# Patient Record
Sex: Female | Born: 1945 | Race: White | Hispanic: No | Marital: Married | Smoking: Never smoker
Health system: Southern US, Community
[De-identification: ages and names within clinical notes are randomized; demographics above are authoritative.]

## PROBLEM LIST (undated history)

## (undated) DIAGNOSIS — K5790 Diverticulosis of intestine, part unspecified, without perforation or abscess without bleeding: Secondary | ICD-10-CM

## (undated) DIAGNOSIS — G4730 Sleep apnea, unspecified: Secondary | ICD-10-CM

## (undated) HISTORY — PX: JOINT REPLACEMENT: SHX530

## (undated) HISTORY — DX: Diverticulosis of intestine, part unspecified, without perforation or abscess without bleeding: K57.90

## (undated) HISTORY — DX: Sleep apnea, unspecified: G47.30

---

## 1981-11-06 HISTORY — PX: RHINOPLASTY: SUR1284

## 1982-11-06 HISTORY — PX: TUBAL LIGATION: SHX77

## 1998-07-01 ENCOUNTER — Other Ambulatory Visit: Admission: RE | Admit: 1998-07-01 | Discharge: 1998-07-01 | Payer: Self-pay | Admitting: Obstetrics and Gynecology

## 1999-09-07 ENCOUNTER — Other Ambulatory Visit: Admission: RE | Admit: 1999-09-07 | Discharge: 1999-09-07 | Payer: Self-pay | Admitting: Obstetrics and Gynecology

## 2000-10-10 ENCOUNTER — Other Ambulatory Visit: Admission: RE | Admit: 2000-10-10 | Discharge: 2000-10-10 | Payer: Self-pay | Admitting: Obstetrics and Gynecology

## 2001-01-16 ENCOUNTER — Emergency Department (HOSPITAL_COMMUNITY): Admission: EM | Admit: 2001-01-16 | Discharge: 2001-01-16 | Payer: Self-pay | Admitting: Emergency Medicine

## 2001-01-24 ENCOUNTER — Emergency Department (HOSPITAL_COMMUNITY): Admission: EM | Admit: 2001-01-24 | Discharge: 2001-01-24 | Payer: Self-pay | Admitting: Emergency Medicine

## 2001-08-13 ENCOUNTER — Ambulatory Visit (HOSPITAL_BASED_OUTPATIENT_CLINIC_OR_DEPARTMENT_OTHER): Admission: RE | Admit: 2001-08-13 | Discharge: 2001-08-13 | Payer: Self-pay | Admitting: Family Medicine

## 2002-01-14 ENCOUNTER — Other Ambulatory Visit: Admission: RE | Admit: 2002-01-14 | Discharge: 2002-01-14 | Payer: Self-pay | Admitting: Obstetrics and Gynecology

## 2002-04-10 ENCOUNTER — Ambulatory Visit (HOSPITAL_COMMUNITY): Admission: RE | Admit: 2002-04-10 | Discharge: 2002-04-10 | Payer: Self-pay | Admitting: Gastroenterology

## 2003-01-20 ENCOUNTER — Other Ambulatory Visit: Admission: RE | Admit: 2003-01-20 | Discharge: 2003-01-20 | Payer: Self-pay | Admitting: Obstetrics and Gynecology

## 2004-02-04 ENCOUNTER — Other Ambulatory Visit: Admission: RE | Admit: 2004-02-04 | Discharge: 2004-02-04 | Payer: Self-pay | Admitting: Obstetrics and Gynecology

## 2004-11-15 ENCOUNTER — Ambulatory Visit: Payer: Self-pay | Admitting: Pulmonary Disease

## 2004-11-28 ENCOUNTER — Ambulatory Visit: Payer: Self-pay | Admitting: Pulmonary Disease

## 2005-04-13 ENCOUNTER — Other Ambulatory Visit: Admission: RE | Admit: 2005-04-13 | Discharge: 2005-04-13 | Payer: Self-pay | Admitting: Addiction Medicine

## 2005-10-18 ENCOUNTER — Ambulatory Visit: Payer: Self-pay | Admitting: Pulmonary Disease

## 2006-06-11 ENCOUNTER — Other Ambulatory Visit: Admission: RE | Admit: 2006-06-11 | Discharge: 2006-06-11 | Payer: Self-pay | Admitting: Obstetrics and Gynecology

## 2007-01-04 ENCOUNTER — Ambulatory Visit: Payer: Self-pay | Admitting: Pulmonary Disease

## 2007-09-09 ENCOUNTER — Other Ambulatory Visit: Admission: RE | Admit: 2007-09-09 | Discharge: 2007-09-09 | Payer: Self-pay | Admitting: Obstetrics and Gynecology

## 2008-12-07 ENCOUNTER — Other Ambulatory Visit: Admission: RE | Admit: 2008-12-07 | Discharge: 2008-12-07 | Payer: Self-pay | Admitting: Obstetrics and Gynecology

## 2008-12-07 ENCOUNTER — Ambulatory Visit: Payer: Self-pay | Admitting: Obstetrics and Gynecology

## 2008-12-07 ENCOUNTER — Encounter: Payer: Self-pay | Admitting: Obstetrics and Gynecology

## 2010-12-20 ENCOUNTER — Encounter: Payer: Self-pay | Admitting: Cardiovascular Disease

## 2010-12-27 ENCOUNTER — Encounter: Payer: Self-pay | Admitting: Cardiovascular Disease

## 2011-01-06 ENCOUNTER — Institutional Professional Consult (permissible substitution) (INDEPENDENT_AMBULATORY_CARE_PROVIDER_SITE_OTHER): Payer: BC Managed Care – PPO | Admitting: Cardiovascular Disease

## 2011-01-06 ENCOUNTER — Encounter: Payer: Self-pay | Admitting: Cardiovascular Disease

## 2011-01-06 DIAGNOSIS — R0602 Shortness of breath: Secondary | ICD-10-CM | POA: Insufficient documentation

## 2011-01-12 NOTE — Assessment & Plan Note (Signed)
Summary: consult: sob. per [facility] [phone]. pt has bcbs.gd  Medications Added ADVAIR DISKUS 500-50 MCG/DOSE AEPB (FLUTICASONE-SALMETEROL) DAILY.....as needed DOXYCYCLINE HYCLATE 100 MG SOLR (DOXYCYCLINE HYCLATE) 1 tab by mouth two times a day * 0.1 % SUSP (FLUOROMETHOLONE ACETATE) as needed        Referring Provider:  Dr. Pearletha Furl. [name] Primary Provider:  Dr. Pearletha Furl. [name]  CC:  referal from Dr. [name] for sob.  History of Present Illness: 65 yo WF with history of asthma, GERD here today for cardiac evaluation. She tells me that she feels well overall. She notices that at times she talks to her husband and gets dyspneic with this. She also has dyspnea with walking. NO chest pain, palpitations, near syncope or syncope. NO prior cardiac issues.   Current Medications (verified): 1)  Advair Diskus 500-50 Mcg/dose Aepb (Fluticasone-Salmeterol) .... Daily...Marland KitchenMarland Kitchenas Needed 2)  Aspirin 81 Mg Tbec (Aspirin) .... Take One Tablet By Mouth Daily 3)  Fenoprofen Calcium 600 Mg Tabs (Fenoprofen Calcium) .... Once Daily 4)  Multivitamins  Tabs (Multiple Vitamin) .... Once Daily 5)  Tylenol Extra Strength 500 Mg Tabs (Acetaminophen) .... As Needed 6)  Doxycycline Hyclate 100 Mg Solr (Doxycycline Hyclate) .Marland Kitchen.. 1 Tab By Mouth Two Times A Day 7)  0.1 % Susp (Fluorometholone Acetate) .... As Needed  Past History:  Past Medical History: Asthma GERD  Past Surgical History: Reviewed history from 01/06/2011 and no changes required. Tubal Ligation - 1984 Rhinoplasty - 1983  Family History: Reviewed history from 01/06/2011 and no changes required. Father: died at age 42 from stroke, MI, CAD,CABG Mother: died at age 60 from emphysema,CHF Siblings: 5 1-brother died at  age 77 from suicide; 1-brother died at age 8 from lung cancer  Social History: Reviewed history from 01/06/2011 and no changes required. Full Time: accountant at [employer] Married  with 1-child Tobacco Use - No. Never  been a smoker Alcohol Use - yes Regular Exercise - yes.  Drug Use - no  Review of Systems       The patient complains of shortness of breath.  The patient denies fatigue, malaise, fever, weight gain/loss, vision loss, decreased hearing, hoarseness, chest pain, palpitations, prolonged cough, wheezing, sleep apnea, coughing up blood, abdominal pain, blood in stool, nausea, vomiting, diarrhea, heartburn, incontinence, blood in urine, muscle weakness, joint pain, leg swelling, rash, skin lesions, headache, fainting, dizziness, depression, anxiety, enlarged lymph nodes, easy bruising or bleeding, and environmental allergies.    Vital Signs:  Patient profile:   65 year old female Height:      67 inches Weight:      190 pounds BMI:     29.87 Pulse rate:   76 / minute Resp:     14 per minute BP sitting:   118 / 80  (left arm)  Vitals Entered By: Kem Parkinson (January 06, 2011 11:52 AM)  Physical Exam  General:  General: Well developed, well nourished, NAD HEENT: OP clear, mucus membranes moist SKIN: warm, dry Neuro: No focal deficits Musculoskeletal: Muscle strength 5/5 all ext Psychiatric: Mood and affect normal Neck: No JVD, no carotid bruits, no thyromegaly, no lymphadenopathy. Lungs:Clear bilaterally, no wheezes, rhonci, crackles CV: RRR no murmurs, gallops rubs Abdomen: soft, NT, ND, BS present Extremities: No edema, pulses 2+.    EKG  Procedure date:  01/06/2011  Findings:      NSR, rate 76 bpm. poor R wave progression precordial leads.   Impression & Recommendations:  Problem # 1:  DYSPNEA (ICD-786.05) Will  arrange echo to evaluate LV function.  Will also arrange exercise treadmill stress test to exclude ischemia as she wants to get into a workout program.   Her updated medication list for this problem includes:    Aspirin 81 Mg Tbec (Aspirin) .Marland Kitchen... Take one tablet by mouth daily  Orders: EKG w/ Interpretation (93000) Echocardiogram (Echo) Treadmill  (Treadmill)  Patient Instructions: 1)  Your physician recommends that you continue on your current medications as directed. Please refer to the Current Medication list given to you today. 2)  Your physician has requested that you have an echocardiogram.  Echocardiography is a painless test that uses sound waves to create images of your heart. It provides your doctor with information about the size and shape of your heart and how well your heart's chambers and valves are working.  This procedure takes approximately one hour. There are no restrictions for this procedure. 3)  Your physician has requested that you have an exercise tolerance test with Dr. Clifton James.  For further information please visit [email or web address]  Please also follow instruction sheet, as given.

## 2011-01-21 ENCOUNTER — Encounter: Payer: Self-pay | Admitting: Cardiovascular Disease

## 2011-01-31 ENCOUNTER — Other Ambulatory Visit (HOSPITAL_COMMUNITY): Payer: Self-pay | Admitting: Cardiovascular Disease

## 2011-01-31 DIAGNOSIS — R0600 Dyspnea, unspecified: Secondary | ICD-10-CM

## 2011-02-02 ENCOUNTER — Ambulatory Visit (INDEPENDENT_AMBULATORY_CARE_PROVIDER_SITE_OTHER): Payer: BC Managed Care – PPO | Admitting: Cardiovascular Disease

## 2011-02-02 ENCOUNTER — Other Ambulatory Visit (HOSPITAL_COMMUNITY): Payer: BC Managed Care – PPO

## 2011-02-02 ENCOUNTER — Ambulatory Visit (HOSPITAL_COMMUNITY): Payer: BC Managed Care – PPO | Attending: Cardiovascular Disease | Admitting: Radiology

## 2011-02-02 DIAGNOSIS — R0600 Dyspnea, unspecified: Secondary | ICD-10-CM

## 2011-02-02 DIAGNOSIS — R0989 Other specified symptoms and signs involving the circulatory and respiratory systems: Secondary | ICD-10-CM | POA: Insufficient documentation

## 2011-02-02 DIAGNOSIS — R0609 Other forms of dyspnea: Secondary | ICD-10-CM | POA: Insufficient documentation

## 2011-02-02 DIAGNOSIS — R0602 Shortness of breath: Secondary | ICD-10-CM

## 2011-02-02 NOTE — Progress Notes (Signed)
Exercise Treadmill Test  Pre-Exercise Testing Evaluation Rhythm: normal sinus  Rate: 96   PR:  .15 QRS:  .07  QT:  .34 QTc: .43     Test  Exercise Tolerance Test Ordering MD: Melene Muller, MD  Interpreting MD:  Melene Muller, MD  Unique Test No: 1  Treadmill:  1  Indication for ETT: exertional dyspnea  Contraindication to ETT: No   Stress Modality: exercise - treadmill  Cardiac Imaging Performed: non   Protocol: standard Bruce - maximal  Max BP:  181/72  Max MPHR (bpm): 156 85% MPR (bpm):  132  MPHR obtained (bpm):  164 % MPHR obtained:  105  Reached 85% MPHR (min:sec):  3:00 Total Exercise Time (min-sec):  6:27  Workload in METS:  7.6 Borg Scale: 15  Reason ETT Terminated:  fatigue    ST Segment Analysis At Rest: normal ST segments - no evidence of significant ST depression With Exercise: no evidence of significant ST depression  Other Information Arrhythmia:  No Angina during ETT:  absent (0) Quality of ETT:  non-diagnostic  ETT Interpretation:  normal - no evidence of ischemia by ST analysis  Comments :Poor exercise tolerance. No EKG evidence of ischemia. No chest pain.    Recommendations: Begin exercise program. No further ischemic workup necessary.

## 2011-03-24 NOTE — Procedures (Signed)
[facility]  Patient:    Anne Wilson, WADSWORTH Visit Number: [id number] MRN: [id number]          Service Type: END Location: ENDO Attending Physician:  Orland Mustard Dictated by:   Llana Aliment. Randa Evens, M.D. Proc. Date: 04/10/02 Admit Date:  04/10/2002   CC:         Tammy R. Collins Scotland, M.D.   Procedure Report  DATE OF BIRTH:  01-Mar-1946.  PROCEDURE:  Colonoscopy.  MEDICATIONS:  Fentanyl 100 mcg, Versed 10 mg IV.  SCOPE:  Olympus pediatric video colonoscope.  INDICATION:  Strong family history of colon cancer.  Father has had colon cancer.  DESCRIPTION OF PROCEDURE:  The procedure had been explained to the patient and consent obtained.  With the patient in the left lateral decubitus position, the Olympus pediatric video colonoscope inserted, advanced under direct visualization.  The prep was excellent.  Using abdominal pressure, we were able to reach the cecum without difficulty.  The ileocecal valve and appendiceal orifice seen.  The scope was withdrawn.  The cecum, ascending colon, hepatic flexure, transverse colon, splenic flexure, descending, and sigmoid colon all seen well.  Scattered diverticula were seen over the colon. No other lesions seen.  No polyps throughout the entire colon.  The rectum was free of polyps.  There were small internal hemorrhoids in the anal canal.  The scope withdrawn.  The patient tolerated the procedure well.  ASSESSMENT: 1. No evidence of colon polyps. 2. Mild diverticulosis.  PLAN:  Will recommend repeating in five years. Dictated by:   Llana Aliment. Randa Evens, M.D. Attending Physician:  Orland Mustard DD:  04/10/02 TD:  04/13/02 Job: [id number]

## 2013-11-19 ENCOUNTER — Encounter: Payer: Self-pay | Admitting: Cardiovascular Disease

## 2014-07-24 ENCOUNTER — Emergency Department (HOSPITAL_COMMUNITY): Payer: Medicare Other

## 2014-07-24 ENCOUNTER — Emergency Department (HOSPITAL_COMMUNITY)
Admission: EM | Admit: 2014-07-24 | Discharge: 2014-07-25 | Disposition: A | Discharge status: Home / Self Care | Payer: Medicare Other | Attending: Emergency Medicine | Admitting: Emergency Medicine

## 2014-07-24 ENCOUNTER — Encounter (HOSPITAL_COMMUNITY): Payer: Self-pay | Admitting: Emergency Medicine

## 2014-07-24 DIAGNOSIS — Z8719 Personal history of other diseases of the digestive system: Secondary | ICD-10-CM | POA: Diagnosis not present

## 2014-07-24 DIAGNOSIS — Z791 Long term (current) use of non-steroidal anti-inflammatories (NSAID): Secondary | ICD-10-CM | POA: Insufficient documentation

## 2014-07-24 DIAGNOSIS — Z7982 Long term (current) use of aspirin: Secondary | ICD-10-CM | POA: Insufficient documentation

## 2014-07-24 DIAGNOSIS — Y9289 Other specified places as the place of occurrence of the external cause: Secondary | ICD-10-CM | POA: Diagnosis not present

## 2014-07-24 DIAGNOSIS — Z792 Long term (current) use of antibiotics: Secondary | ICD-10-CM | POA: Insufficient documentation

## 2014-07-24 DIAGNOSIS — S0990XA Unspecified injury of head, initial encounter: Secondary | ICD-10-CM | POA: Diagnosis present

## 2014-07-24 DIAGNOSIS — S060X1A Concussion with loss of consciousness of 30 minutes or less, initial encounter: Secondary | ICD-10-CM | POA: Diagnosis not present

## 2014-07-24 DIAGNOSIS — Y9389 Activity, other specified: Secondary | ICD-10-CM | POA: Insufficient documentation

## 2014-07-24 DIAGNOSIS — IMO0002 Reserved for concepts with insufficient information to code with codable children: Secondary | ICD-10-CM | POA: Diagnosis not present

## 2014-07-24 DIAGNOSIS — W1809XA Striking against other object with subsequent fall, initial encounter: Secondary | ICD-10-CM | POA: Diagnosis not present

## 2014-07-24 DIAGNOSIS — S0101XA Laceration without foreign body of scalp, initial encounter: Secondary | ICD-10-CM

## 2014-07-24 DIAGNOSIS — W108XXA Fall (on) (from) other stairs and steps, initial encounter: Secondary | ICD-10-CM | POA: Insufficient documentation

## 2014-07-24 DIAGNOSIS — J45909 Unspecified asthma, uncomplicated: Secondary | ICD-10-CM | POA: Diagnosis not present

## 2014-07-24 DIAGNOSIS — S0100XA Unspecified open wound of scalp, initial encounter: Secondary | ICD-10-CM | POA: Diagnosis not present

## 2014-07-24 MED ORDER — ONDANSETRON HCL 4 MG/2ML IJ SOLN
4.0000 mg | Freq: Once | INTRAMUSCULAR | Status: AC
  Administered 2014-07-24: 4 mg via INTRAVENOUS

## 2014-07-24 MED ORDER — LIDOCAINE HCL (PF) 1 % IJ SOLN
30.0000 mL | Freq: Once | INTRAMUSCULAR | Status: AC
  Administered 2014-07-25: 30 mL
  Filled 2014-07-24: qty 30

## 2014-07-24 MED ORDER — ONDANSETRON HCL 4 MG/2ML IJ SOLN
4.0000 mg | Freq: Once | INTRAMUSCULAR | Status: AC
  Administered 2014-07-24: 4 mg via INTRAVENOUS
  Filled 2014-07-24: qty 2

## 2014-07-24 MED ORDER — ONDANSETRON HCL 4 MG/2ML IJ SOLN
INTRAMUSCULAR | Status: AC
  Administered 2014-07-24: 4 mg via INTRAVENOUS
  Filled 2014-07-24: qty 2

## 2014-07-24 NOTE — ED Notes (Signed)
The tech has cleaned the laceration to the left side of the head. The tech has reported to the RN in charge.

## 2014-07-24 NOTE — ED Provider Notes (Signed)
CSN: [id number]     Arrival date & time 07/24/14  2212 History   First MD Initiated Contact with Patient 07/24/14 2311     Chief Complaint  Patient presents with  . Fall      Patient is a 68 y.o. female presenting with head injury. The history is provided by the patient.  Head Injury Location:  L parietal Pain details:    Quality:  Aching   Severity:  Mild   Progression:  Worsening Chronicity:  New Relieved by:  None tried Worsened by:  Nothing tried Associated symptoms: loss of consciousness and nausea   Associated symptoms: no neck pain   Pt reports she was coming down attic stairs when she lost her footing and fell from third step from bottom hitting her head.  She had transient LOC.  She now has laceration to scalp  She denies neck or back pain No cp is reported No weakness is reported   She reports tetanus is UTD  Past Medical History  Diagnosis Date  . Asthma   . GERD (gastroesophageal reflux disease)    Past Surgical History  Procedure Laterality Date  . Tubal ligation  1984  . Rhinoplasty  1983   Family History  Problem Relation Age of Onset  . Coronary artery disease Father   . Heart failure Mother   . Emphysema Mother   . Lung cancer Brother    History  Substance Use Topics  . Smoking status: Never Smoker   . Smokeless tobacco: Not on file  . Alcohol Use: Yes     Comment: social    OB History   Grav Para Term Preterm Abortions TAB SAB Ect Mult Living                 Review of Systems  Gastrointestinal: Positive for nausea.  Musculoskeletal: Negative for neck pain.  Neurological: Positive for loss of consciousness.  All other systems reviewed and are negative.     Allergies  Review of patient's allergies indicates not on file.  Home Medications   Prior to Admission medications   Medication Sig Start Date End Date Taking? Authorizing Provider  acetaminophen (TYLENOL) 500 MG tablet Take 500 mg by mouth every 6 (six) hours as needed.       Historical Provider, MD  aspirin 81 MG tablet Take 81 mg by mouth daily.      Historical Provider, MD  doxycycline (VIBRAMYCIN) 100 MG capsule Take 100 mg by mouth 2 (two) times daily.      Historical Provider, MD  fenoprofen (NALFON) 600 MG TABS Take by mouth daily.      Historical Provider, MD  fluorometholone (FML) 0.1 % ophthalmic suspension 1 drop as needed.      Historical Provider, MD  Fluticasone-Salmeterol (ADVAIR DISKUS) 500-50 MCG/DOSE AEPB Inhale 1 puff into the lungs daily as needed.      Historical Provider, MD  Multiple Vitamin (MULTIVITAMIN) tablet Take 1 tablet by mouth daily.      Historical Provider, MD   BP 124/57  Pulse 89  Temp(Src) 97.8 F (36.6 C) (Oral)  Resp 15  SpO2 98% Physical Exam CONSTITUTIONAL: Well developed/well nourished HEAD: laceration to left parietal scalp.  No active bleeding noted, no other signs of trauma EYES: EOMI/PERRL ENMT: Mucous membranes moist NECK: supple no meningeal signs SPINE:entire spine nontender, No bruising/crepitance/stepoffs noted to spine NEXUS criteria met CV: S1/S2 noted, no murmurs/rubs/gallops noted LUNGS: Lungs are clear to auscultation bilaterally, no apparent distress ABDOMEN: soft,  nontender, no rebound or guarding NEURO: Pt is awake/alert, moves all extremitiesx4, GCS 15 EXTREMITIES: pulses normal, full ROM, All other extremities/joints palpated/ranged and nontender SKIN: warm, color normal PSYCH: no abnormalities of mood noted  ED Course  Procedures    LACERATION REPAIR Performed by: Sharyon Cable Consent: Verbal consent obtained. Risks and benefits: risks, benefits and alternatives were discussed Patient identity confirmed: provided demographic data Time out performed prior to procedure Prepped and Draped in normal sterile fashion Wound explored Laceration Location: left parietal scalp Laceration Length: 3cm No Foreign Bodies seen or palpated Anesthesia: local infiltration Local anesthetic:  lidocaine 1%   Anesthetic total: 8 ml Amount of cleaning: standard Skin closure: staple Number of sutures or staples: 4 Technique: staples Patient tolerance: Patient tolerated the procedure well with no immediate complications.  Imaging Review Ct Head Wo Contrast  07/25/2014   CLINICAL DATA:  Fall, fall downstairs.  Nausea.  EXAM: CT HEAD WITHOUT CONTRAST  TECHNIQUE: Contiguous axial images were obtained from the base of the skull through the vertex without intravenous contrast.  COMPARISON:  None.  FINDINGS: No acute intracranial hemorrhage. No focal mass lesion. No CT evidence of acute infarction. No midline shift or mass effect. No hydrocephalus. Basilar cisterns are patent.  Small scalp laceration over the left frontal bone.  Paranasal sinuses and  mastoid air cells are clear.  IMPRESSION: 1. Small scalp laceration on the left. 2. No intracranial trauma.   Electronically Signed   By: Suzy Bouchard M.D.   On: 07/25/2014 00:25   Pt ambulatory She has no other complaints Discussed strict return precautions She is appropriate for d/c home   MDM   Final diagnoses:  Scalp laceration, initial encounter  Concussion, with loss of consciousness of 30 minutes or less, initial encounter    Nursing notes including past medical history and social history reviewed and considered in documentation     Sharyon Cable, MD 07/25/14 [phone]

## 2014-07-24 NOTE — ED Notes (Addendum)
Per EMS, pt was coming down the attic stairs when she fell from the third step from the bottom and hit the left side of her head on a door knob. NO loc. Pt denies blood thinners. Alert x 4. Pt is experiencing nausea at this time. Pt vomited once.

## 2014-07-25 NOTE — ED Notes (Signed)
Pt ambulated independently with no problem.

## 2014-07-25 NOTE — ED Notes (Signed)
Patient transported to CT 

## 2014-07-25 NOTE — Discharge Instructions (Signed)

## 2016-09-18 ENCOUNTER — Telehealth (INDEPENDENT_AMBULATORY_CARE_PROVIDER_SITE_OTHER): Payer: Self-pay | Admitting: Orthopaedic Surgery

## 2016-09-18 DIAGNOSIS — M25551 Pain in right hip: Secondary | ICD-10-CM

## 2016-09-18 NOTE — Telephone Encounter (Signed)
Patient was seen in July for her knee and was told if things didn't get any better she would need an injection in her hip by Dr. Alvester MorinNewton. Pt would like to be referred.

## 2016-09-20 NOTE — Telephone Encounter (Signed)
Please advise 

## 2016-09-21 NOTE — Telephone Encounter (Signed)
OK TO SCHEDULE HIP INJ WITH DR Alvester MorinNEWTON

## 2016-09-22 ENCOUNTER — Telehealth (INDEPENDENT_AMBULATORY_CARE_PROVIDER_SITE_OTHER): Payer: Self-pay | Admitting: Orthopaedic Surgery

## 2016-09-22 NOTE — Telephone Encounter (Signed)
Referral sent 

## 2016-09-22 NOTE — Telephone Encounter (Signed)
I just put in order this am. Thanks

## 2016-09-26 NOTE — Telephone Encounter (Signed)
Ok thanks 

## 2016-09-26 NOTE — Telephone Encounter (Signed)
OK TO SCHEDULE HIP INJECTION

## 2016-10-11 ENCOUNTER — Ambulatory Visit (INDEPENDENT_AMBULATORY_CARE_PROVIDER_SITE_OTHER): Payer: Self-pay | Admitting: Physical Medicine and Rehabilitation

## 2016-10-12 ENCOUNTER — Encounter (INDEPENDENT_AMBULATORY_CARE_PROVIDER_SITE_OTHER): Payer: Self-pay | Admitting: Physical Medicine and Rehabilitation

## 2016-10-12 ENCOUNTER — Ambulatory Visit (INDEPENDENT_AMBULATORY_CARE_PROVIDER_SITE_OTHER): Payer: Medicare Other | Admitting: Physical Medicine and Rehabilitation

## 2016-10-12 VITALS — BP 139/75

## 2016-10-12 DIAGNOSIS — G8929 Other chronic pain: Secondary | ICD-10-CM

## 2016-10-12 DIAGNOSIS — M25561 Pain in right knee: Secondary | ICD-10-CM | POA: Diagnosis not present

## 2016-10-12 DIAGNOSIS — M25551 Pain in right hip: Secondary | ICD-10-CM

## 2016-10-12 MED ORDER — TRIAMCINOLONE ACETONIDE 40 MG/ML IJ SUSP
80.0000 mg | INTRAMUSCULAR | Status: AC | PRN
  Administered 2016-10-12: 80 mg via INTRA_ARTICULAR

## 2016-10-12 MED ORDER — LIDOCAINE HCL 2 % IJ SOLN
4.0000 mL | INTRAMUSCULAR | Status: AC | PRN
  Administered 2016-10-12: 4 mL

## 2016-10-12 NOTE — Patient Instructions (Signed)

## 2016-10-12 NOTE — Progress Notes (Signed)
Minus [name] - 70 y.o. female MRN [id number]  Date of birth: 12/30/1945  Office Visit Note: Visit Date: 10/12/2016 PCP: Anne MeoARONSON,Wilson A, MD Referred by: Anne Wilson, Richard, MD  Subjective: Chief Complaint  Patient presents with  . Right Hip - Pain  . Right Knee - Pain   HPI: Anne Wilson is Wilson 70 year old female that saw Arlys JohnBrian and Dr. Cleophas DunkerWhitfield back in July. Right knee and thigh pain for several months. No actual hip or groin pain. Worse with getting up after sitting long periods. Denies numbness or tingling. Taking Aleve which helps at least get over the harder parts of the day. She denies any numbness tingling or paresthesias. No pain down the legs. With rotation she does get some groin pain but not on Wilson daily basis just walking. She states that the pain is in the anterior front of the thigh down through the knee. It seems to radiate up from the knee to the thigh. She has had Wilson knee injection which was not beneficial by Arlys JohnBrian in July. She has been just putting up with it up until recently as it became more and more problematic. He does keep her from doing some things that she would like to do. She has not had focused physical therapy of the hip. She has assumed all along has been her knee. She has no history of prior knee surgery no history of prior lumbar surgery. X-ray images that show arthritis of the right hip with Wilson fairly normal-appearing right knee.    Review of Systems  Constitutional: Negative for chills, fever, malaise/fatigue and weight loss.  HENT: Negative for hearing loss and sinus pain.   Eyes: Negative for blurred vision, double vision and photophobia.  Respiratory: Negative for cough and shortness of breath.   Cardiovascular: Negative for chest pain, palpitations and leg swelling.  Gastrointestinal: Negative for abdominal pain, nausea and vomiting.  Genitourinary: Negative for flank pain.  Musculoskeletal: Positive for joint pain. Negative for myalgias.  Skin: Negative  for itching and rash.  Neurological: Negative for tremors, focal weakness and weakness.  Endo/Heme/Allergies: Negative.   Psychiatric/Behavioral: Negative for depression.  All other systems reviewed and are negative.  Otherwise per HPI.  Assessment & Plan: Visit Diagnoses:  1. Pain in right hip   2. Chronic pain of right knee     Plan: Findings:  Chronic worsening right knee and hip pain. 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 Prior injection in the left knee was not very helpful. We did complete diagnostic and therapeutic anesthetic hip arthrogram today and she did get some benefit in the anesthetic phase. We have also suggested she completes her physical therapy and she's not take this up with Dr. Magnus IvanBlackman potentially. Her husband is seen him right now for right quadriceps tear has [facility]. We also talked about medications and the use of Aleve. We've also talked about supplements such as tumeric. I spent more than 25 minutes speaking face-to-face with the patient with 50% of the time in counseling. I spent more than 20 minutes speaking face-to-face with the patient with 50% of the time in counseling.    Meds & Orders: No orders of the defined types were placed in this encounter.   Orders Placed This Encounter  Procedures  . Large Joint Injection/Arthrocentesis    Follow-up: No Follow-up on file.   Procedures: Large Joint Inj Date/Time: 10/12/2016 10:27 AM Performed by: Tyrell AntonioNEWTON, Miles Leyda Authorized by: Alvester MorinNEWTON,  Bron Snellings   Consent Given by:  Patient Site marked: the procedure site was marked   Timeout: prior to procedure the correct patient, procedure, and site was verified   Indications:  Pain Location:  Hip Site:  R hip joint Prep: patient was prepped and draped in usual sterile fashion   Needle Size:  22 G Needle length (in): 5.0 inches. Approach:  Anterior Ultrasound Guidance: No     Fluoroscopic Guidance: No   Arthrogram: Yes   Medications:  4 mL lidocaine 2 %; 80 mg triamcinolone acetonide 40 MG/ML Aspiration Attempted: Yes   Patient tolerance:  Patient tolerated the procedure well with no immediate complications  Arthrogram demonstrated excellent flow of contrast throughout the joint surface without extravasation or obvious defect.  The patient had relief of symptoms during the anesthetic phase of the injection.     No notes on file   Clinical History: No specialty comments available.  She reports that she has never smoked. She does not have any smokeless tobacco history on file. No results for input(s): HGBA1C, LABURIC in the last 8760 hours.  Objective:  VS:  HT:    WT:   BMI:     BP:139/75  HR: bpm  TEMP: ( )  RESP:  Physical Exam  Constitutional: She is oriented to person, place, and time. She appears well-developed and well-nourished.  Eyes: Conjunctivae and EOM are normal. Pupils are equal, round, and reactive to light.  Cardiovascular: Normal rate and intact distal pulses.   Pulmonary/Chest: Effort normal.  Musculoskeletal:  The patient ambulates with an antalgic gait to the right. She has good knee stability bilaterally with no pain at the joint line and no pain over the pens anserine bursa. She does have pain with hip rotation internally that does reproduce Wilson lot of the pain anteriorly in the thigh and knee.  Neurological: She is alert and oriented to person, place, and time.  Skin: Skin is warm and dry. No rash noted. No erythema.  Psychiatric: She has Wilson normal mood and affect. Her behavior is normal.  Nursing note and vitals reviewed.   Ortho Exam Imaging: No results found.  Past Medical/Family/Surgical/Social History: Medications & Allergies reviewed per EMR Patient Active Problem List   Diagnosis Date Noted  . DYSPNEA 01/06/2011   Past Medical History:  Diagnosis Date  . Asthma   . GERD (gastroesophageal reflux disease)    Family  History  Problem Relation Age of Onset  . Coronary artery disease Father   . Heart failure Mother   . Emphysema Mother   . Lung cancer Brother    Past Surgical History:  Procedure Laterality Date  . RHINOPLASTY  1983  . TUBAL LIGATION  1984   Social History   Occupational History  . accountant [employer]   Social History Main Topics  . Smoking status: Never Smoker  . Smokeless tobacco: Not on file  . Alcohol use Yes     Comment: social   . Drug use: No  . Sexual activity: Not on file

## 2017-03-09 ENCOUNTER — Telehealth (INDEPENDENT_AMBULATORY_CARE_PROVIDER_SITE_OTHER): Payer: Self-pay

## 2017-03-09 NOTE — Telephone Encounter (Signed)
Patient requesting another rt hip injection. Had one on 10/12/16. Ok to repeat if helped?

## 2017-03-12 NOTE — Telephone Encounter (Signed)
Yes if it helped and no new trauma etc. f/up Anne CampbellPeter Whitfield after

## 2017-03-12 NOTE — Telephone Encounter (Signed)
Pt is scheduled for 03/20/17

## 2017-03-20 ENCOUNTER — Ambulatory Visit (INDEPENDENT_AMBULATORY_CARE_PROVIDER_SITE_OTHER): Payer: Medicare Other | Admitting: Physical Medicine and Rehabilitation

## 2017-03-20 ENCOUNTER — Encounter (INDEPENDENT_AMBULATORY_CARE_PROVIDER_SITE_OTHER): Payer: Self-pay | Admitting: Physical Medicine and Rehabilitation

## 2017-03-20 ENCOUNTER — Ambulatory Visit (INDEPENDENT_AMBULATORY_CARE_PROVIDER_SITE_OTHER): Payer: Medicare Other

## 2017-03-20 DIAGNOSIS — M25551 Pain in right hip: Secondary | ICD-10-CM | POA: Diagnosis not present

## 2017-03-20 MED ORDER — TRIAMCINOLONE ACETONIDE 40 MG/ML IJ SUSP
80.0000 mg | INTRAMUSCULAR | Status: AC | PRN
  Administered 2017-03-20: 80 mg via INTRA_ARTICULAR

## 2017-03-20 MED ORDER — BUPIVACAINE HCL 0.5 % IJ SOLN
3.0000 mL | INTRAMUSCULAR | Status: AC | PRN
  Administered 2017-03-20: 3 mL via INTRA_ARTICULAR

## 2017-03-20 NOTE — Patient Instructions (Signed)

## 2017-03-20 NOTE — Progress Notes (Signed)
Anne Wilson - 71 y.o. female MRN [id number]  Date of birth: May 13, 1946  Office Visit Note: Visit Date: 03/20/2017 PCP: Geoffry Paradise, MD Referred by: Geoffry Paradise, MD  Subjective: Chief Complaint  Patient presents with  . Right Hip - Pain   HPI: Anne Wilson is a very pleasant 71-year-old female with known hip osteoarthritis of the right hip. She's having right hip and groin pain again. Radiates down to the knee. She did well with the last injection which was in December and she did well for several months. She is probably looking at hip replacement at some point. We will go ahead and repeat the injection today.    ROS Otherwise per HPI.  Assessment & Plan: Visit Diagnoses:  1. Pain in right hip     Plan: Findings:  Right intra-articular hip injection with fluoroscopic guidance. Patient did have relief joint anesthetic phase.    Meds & Orders: No orders of the defined types were placed in this encounter.   Orders Placed This Encounter  Procedures  . Large Joint Injection/Arthrocentesis  . XR C-ARM NO REPORT    Follow-up: Return if symptoms worsen or fail to improve, for Dr. Cleophas Dunker.   Procedures: Intra-articular hip injection fluoroscopic guidance. Date/Time: 03/20/2017 2:27 PM Performed by: Tyrell Antonio Authorized by: Tyrell Antonio   Consent Given by:  Patient Site marked: the procedure site was marked   Timeout: prior to procedure the correct patient, procedure, and site was verified   Indications:  Pain and diagnostic evaluation Location:  Hip Site:  R hip joint Prep: patient was prepped and draped in usual sterile fashion   Needle Size:  22 G Needle Length:  3.5 inches Approach:  Anterior Ultrasound Guidance: No   Fluoroscopic Guidance: Yes   Arthrogram: No   Medications:  3 mL bupivacaine 0.5 %; 80 mg triamcinolone acetonide 40 MG/ML Aspiration Attempted: Yes   Patient tolerance:  Patient tolerated the procedure well with no immediate  complications  There was excellent flow of contrast producing a partial arthrogram of the hip. The patient did have relief of symptoms during the anesthetic phase of the injection.     No notes on file   Clinical History: No specialty comments available.  She reports that she has never smoked. She does not have any smokeless tobacco history on file. No results for input(s): HGBA1C, LABURIC in the last 8760 hours.  Objective:  VS:  HT:    WT:   BMI:     BP:   HR: bpm  TEMP: ( )  RESP:  Physical Exam  Musculoskeletal:  Patient ambulates without aid with antalgic gait to the right.    Ortho Exam Imaging: Xr C-arm No Report  Result Date: 03/20/2017 Please see Notes or Procedures tab for imaging impression.   Past Medical/Family/Surgical/Social History: Medications & Allergies reviewed per EMR Patient Active Problem List   Diagnosis Date Noted  . DYSPNEA 01/06/2011   Past Medical History:  Diagnosis Date  . Asthma   . GERD (gastroesophageal reflux disease)    Family History  Problem Relation Age of Onset  . Coronary artery disease Father   . Heart failure Mother   . Emphysema Mother   . Lung cancer Brother    Past Surgical History:  Procedure Laterality Date  . RHINOPLASTY  1983  . TUBAL LIGATION  1984   Social History   Occupational History  . accountant [employer]   Social History Main Topics  . Smoking status: Never Smoker  .  Smokeless tobacco: Not on file  . Alcohol use Yes     Comment: social   . Drug use: No  . Sexual activity: Not on file

## 2017-03-20 NOTE — Progress Notes (Deleted)
Right hip and groin pain. Radiates down to knee. States she did well with the last injection. Pain free for several months.

## 2017-04-09 ENCOUNTER — Ambulatory Visit (INDEPENDENT_AMBULATORY_CARE_PROVIDER_SITE_OTHER): Payer: Medicare Other | Admitting: Neurology

## 2017-04-09 ENCOUNTER — Encounter: Payer: Self-pay | Admitting: Neurology

## 2017-04-09 VITALS — BP 152/90 | HR 80 | Ht 66.0 in | Wt 182.0 lb

## 2017-04-09 DIAGNOSIS — Z820 Family history of epilepsy and other diseases of the nervous system: Secondary | ICD-10-CM

## 2017-04-09 DIAGNOSIS — E663 Overweight: Secondary | ICD-10-CM | POA: Diagnosis not present

## 2017-04-09 DIAGNOSIS — G4733 Obstructive sleep apnea (adult) (pediatric): Secondary | ICD-10-CM

## 2017-04-09 DIAGNOSIS — J45909 Unspecified asthma, uncomplicated: Secondary | ICD-10-CM | POA: Diagnosis not present

## 2017-04-09 NOTE — Patient Instructions (Signed)

## 2017-04-09 NOTE — Progress Notes (Addendum)
Subjective:    Patient ID: Anne Wilson is a 71 y.o. female.  HPI     History:   Dear Dr. [name],   I saw your patient, Anne Wilson, upon your kind request in my neurologic clinic today for initial consultation of her sleep disorder, in particular, reevaluation of her primary diagnosis of OSA. The patient is unaccompanied today. As you know, Anne Wilson is a 71 year old right-handed woman with an underlying medical history of hypertension with mild chronic kidney disease, reflux disease, asthma, diverticulosis and overweight state, who was previously diagnosed with obstructive sleep apnea and placed on CPAP therapy. Her sleep study results from 2002 were reviewed today. She had a baseline sleep study on 08/13/2001. She was diagnosed with mild obstructive sleep apnea, RDI was 8 per hour, mild intermittent snoring was noted, occasional PVCs were noted, average oxygen saturation was 97%, O2 nadir was 91%. She had no significant PLMS. I reviewed your office note from 02/19/2017, which you kindly included. Her Epworth sleepiness score is 4 out of 24 today, her fatigue score is 10 out of 63. Lost weight on Qsymia, highest weight 192 and lost to 167 lb, now at 182, was 180 in 2002 at the time of her PSG. She reports that her snoring has become worse over time. It really bothers her husband. She would consider using CPAP but also an oral appliance is something she is interested in. Her brother has sleep apnea and uses a CPAP machine successfully. 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 She works part-time. Bedtime is typically 10:15 PM and wakeup time typically 6:15 AM. She denies any restless leg symptoms but has had leg cramps at night. She denies morning headaches or night to night nocturia. She does wake up in the middle of the night, often without a reason and sometimes has difficulty going back to sleep. Falling asleep initially is not a problem for  her. She has one daughter, age 41 doing her doctorate at [employer]. She wakes up with a dry mouth often. When she travels with friends and has to share a bedroom, her snoring is disruptive and embarrassing to her. She is a nonsmoker. She drinks alcohol less than once per week and caffeine in the form of coffee, usually 2 cups in the morning and diet Coke usually at lunch or in the afternoon.   Her Past Medical History Is Significant For: Past Medical History:  Diagnosis Date  . Asthma   . Diverticulosis   . GERD (gastroesophageal reflux disease)   . Mild sleep apnea     Her Past Surgical History Is Significant For: Past Surgical History:  Procedure Laterality Date  . RHINOPLASTY  1983  . TUBAL LIGATION  1984    Her Family History Is Significant For: Family History  Problem Relation Age of Onset  . Coronary artery disease Father   . Heart failure Mother   . Emphysema Mother   . Lung cancer Brother     Her Social History Is Significant For: Social History   Social History  . Marital status: Married    Spouse name: N/A  . Number of children: a  . 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 Smoking status: Never Smoker  . Smokeless tobacco: Never Used  . Alcohol use Yes     Comment: social   . Drug use: No  .  Sexual activity: Not Asked   Other Topics Concern  . None   Social History Narrative  . None    Her Allergies Are:  No Known Allergies:   Her Current Medications Are:  Outpatient Encounter Prescriptions as of 04/09/2017  Medication Sig  . acetaminophen (TYLENOL) 500 MG tablet Take 500 mg by mouth every 6 (six) hours as needed.    Marland Kitchen aspirin 81 MG tablet Take 81 mg by mouth daily.    . budesonide-formoterol (SYMBICORT) 160-4.5 MCG/ACT inhaler Inhale 2 puffs into the lungs 2 (two) times daily.  . [DISCONTINUED] doxycycline (VIBRAMYCIN) 100 MG capsule Take 100 mg by mouth 2 (two) times daily.    .  [DISCONTINUED] fenoprofen (NALFON) 600 MG TABS Take by mouth daily.    . [DISCONTINUED] fluorometholone (FML) 0.1 % ophthalmic suspension 1 drop as needed.    . [DISCONTINUED] Fluticasone-Salmeterol (ADVAIR DISKUS) 500-50 MCG/DOSE AEPB Inhale 1 puff into the lungs daily as needed.    . [DISCONTINUED] Multiple Vitamin (MULTIVITAMIN) tablet Take 1 tablet by mouth daily.     No facility-administered encounter medications on file as of 04/09/2017.   :  Review of Systems:  Out of a complete 14 point review of systems, all are reviewed and negative with the exception of these symptoms as listed below: Review of Systems  Neurological:       Pt presents today to discuss her sleep. Pt had a sleep study 20 years ago at [facility]. Pt does endorse snoring.  Epworth Sleepiness Scale 0= would never doze 1= slight chance of dozing 2= moderate chance of dozing 3= high chance of dozing  Sitting and reading: 1 Watching TV: 2 Sitting inactive in a public place (ex. Theater or meeting): 0 As a passenger in a car for an hour without a break: 0 Lying down to rest in the afternoon: 1 Sitting and talking to someone: 0 Sitting quietly after lunch (no alcohol): 0 In a car, while stopped in traffic: 0 Total: 4     Objective:  Neurologic Exam  Physical Exam Physical Examination:   Vitals:   04/09/17 1633  BP: (!) 152/90  Pulse: 80    General Examination: The patient is a very pleasant 71 y.o. female in no acute distress. She appears well-developed and well-nourished and very well groomed.   HEENT: Normocephalic, atraumatic, pupils are equal, round and reactive to light and accommodation. Mild b/l cataracts, extraocular tracking is good without limitation to gaze excursion or nystagmus noted. Normal smooth pursuit is noted. Hearing is grossly intact. Face is symmetric with normal facial animation and normal facial sensation. Speech is clear with no dysarthria noted. There is no hypophonia. There is  no lip, neck/head, jaw or voice tremor. Neck is supple with full range of passive and active motion. There are no carotid bruits on auscultation. Oropharynx exam reveals: mild mouth dryness, adequate dental hygiene and mild airway crowding, due to smaller airway entry and redundant soft palate. Mallampati is class I. Tongue protrudes centrally and palate elevates symmetrically. Tonsils are absent. Neck size is 13 3/8 inches. She has a Mild overbite.   Chest: Clear to auscultation without wheezing, rhonchi or crackles noted.  Heart: S1+S2+0, regular and normal without murmurs, rubs or gallops noted.   Abdomen: Soft, non-tender and non-distended with normal bowel sounds appreciated on auscultation.  Extremities: There is no pitting edema in the distal lower extremities bilaterally. Pedal pulses are intact.  Skin: Warm and dry without trophic changes noted. There are spider veins  in both distal legs.  Musculoskeletal: exam reveals no obvious joint deformities, tenderness or joint swelling or erythema.   Neurologically:  Mental status: The patient is awake, alert and oriented in all 4 spheres. Her immediate and remote memory, attention, language skills and fund of knowledge are appropriate. There is no evidence of aphasia, agnosia, apraxia or anomia. Speech is clear with normal prosody and enunciation. Thought process is linear. Mood is normal and affect is normal.  Cranial nerves II - XII are as described above under HEENT exam. In addition: shoulder shrug is normal with equal shoulder height noted. Motor exam: Normal bulk, strength and tone is noted. There is no drift, tremor or rebound. Romberg is negative. Reflexes are 1+ throughout. Fine motor skills and coordination: intact with normal finger taps, normal hand movements, normal rapid alternating patting, normal foot taps and normal foot agility.  Cerebellar testing: No dysmetria or intention tremor on finger to nose testing. Heel to shin is  unremarkable bilaterally. There is no truncal or gait ataxia.  Sensory exam: intact to light touch in the upper and lower extremities.  Gait, station and balance: She stands easily. No veering to one side is noted. No leaning to one side is noted. Posture is age-appropriate and stance is narrow based. Gait shows normal stride length and normal pace. No problems turning are noted. Tandem walk is unremarkable.    Assessment and Plan:   In summary, Minus LibertyCarol S Jakob is a very pleasant 71 y.o.-year old female with an underlying medical history of hypertension with mild chronic kidney disease, reflux disease, asthma, diverticulosis and overweight state, whose history and physical exam are in keeping with obstructive sleep apnea (OSA). I had a long chat with the patient about my findin gs and the diagnosis of OSA, its prognosis and treatment options. We talked about medical treatments, surgical interventions and non-pharmacological approaches. I explained in particular the risks and ramifications of untreated moderate to severe OSA, especially with respect to developing cardiovascular disease down the Road, including congestive heart failure, difficult to treat hypertension, cardiac arrhythmias, or stroke. Even type 2 diabetes has, in part, been linked to untreated OSA. Symptoms of untreated OSA include daytime sleepiness, memory problems, mood irritability and mood disorder such as depression and anxiety, lack of energy, as well as recurrent headaches, especially morning headaches. We talked about trying to maintain a healthy lifestyle in general, as well as the importance of weight control. I encouraged the patient to eat healthy, exercise daily and keep well hydrated, to keep a scheduled bedtime and wake time routine, to not skip any meals and eat healthy snacks in between meals. I advised the patient not to drive when feeling sleepy. I recommended the following at this time: sleep study with potential positive  airway pressure titration. (We will score hypopneas at 4%).   I explained the sleep test procedure to the patient and also outlined possible surgical and non-surgical treatment options of OSA, including the use of a custom-made dental device (which would require a referral to a specialist dentist or oral surgeon), upper airway surgical options, such as pillar implants, radiofrequency surgery, tongue base surgery, and UPPP (which would involve a referral to an ENT surgeon). Rarely, jaw surgery such as mandibular advancement may be considered.  I also explained the CPAP treatment option to the patient, who indicated that she would be willing to try CPAP if the need arises. I explained the importance of being compliant with PAP treatment, not only for insurance purposes  but primarily to improve Her symptoms, and for the patient's long term health benefit, including to reduce Her cardiovascular risks. I answered all her questions today and the patient was in agreement. I would like to see her back after the sleep study is completed and encouraged her to call with any interim questions, concerns, problems or updates.   Thank you very much for allowing me to participate in the care of this nice patient. If I can be of any further assistance to you please do not hesitate to call me at [phone].  Sincerely,   Huston Foley, MD, PhD

## 2017-05-10 ENCOUNTER — Ambulatory Visit (INDEPENDENT_AMBULATORY_CARE_PROVIDER_SITE_OTHER): Payer: Medicare Other | Admitting: Neurology

## 2017-05-10 DIAGNOSIS — R0683 Snoring: Secondary | ICD-10-CM

## 2017-05-10 DIAGNOSIS — G4720 Circadian rhythm sleep disorder, unspecified type: Secondary | ICD-10-CM

## 2017-05-10 DIAGNOSIS — G4733 Obstructive sleep apnea (adult) (pediatric): Secondary | ICD-10-CM

## 2017-05-10 DIAGNOSIS — G479 Sleep disorder, unspecified: Secondary | ICD-10-CM

## 2017-05-16 NOTE — Progress Notes (Signed)
Patient referred by Dr. Jacky KindleAronson, seen by me on 04/09/17, diagnostic PSG on 05/10/17. Please call and notify the patient that the recent sleep study did not show any significant obstructive sleep apnea or intrinsic sleep d/o. Mild to moderate snoring was noted. For disturbing snoring, an oral appliance (through a qualified dentist) can be considered. There was sleep fragmentation, spontaneous arousals and mildly abnormal sleep stage percentages; these are nonspecific findings. Slept about 60% of the time. Lot of wake time, but did achieve all stages of sleep.  Please remind patient to try to maintain good sleep hygiene, which means: Keep a regular sleep and wake schedule and make enough time for sleep (7 1/2 to 8 1/2 hours for the average adult), try not to exercise or have a meal within 2 hours of your bedtime, try to keep your bedroom conducive for sleep, that is, cool and dark, without light distractors such as an illuminated alarm clock, and refrain from watching TV right before sleep or in the middle of the night and do not keep the TV or radio on during the night. If a nightlight is used, have it away from the visual field. Also, try not to use or play on electronic devices at bedtime, such as your cell phone, tablet PC or laptop. If you like to read at bedtime on an electronic device, try to dim the background light as much as possible. Do not eat in the middle of the night. Keep pets away from the bedroom environment. For stress relief, try meditation, deep breathing exercises (there are many books and CDs available), a white noise machine or fan can help to diffuse other noise distractors, such as traffic noise. Do not drink alcohol before bedtime, as it can disturb sleep and cause middle of the night awakenings. Never mix alcohol and sedating medications! Avoid narcotic pain medication close to bedtime, as opioids/narcotics can suppress breathing drive and breathing effort.   Please inform patient that she  can FU with PCP. Also, route or fax report to PCP and referring MD, if other than PCP.  Once you have spoken to patient, you can close this encounter.   Thanks,  Huston FoleySaima Hennessey Cantrell, MD, PhD [facility])

## 2017-05-16 NOTE — Procedures (Signed)
PATIENT'S NAME:  Anne Wilson, Anne Wilson DOB:      11/14/1945      MR#:    [id number]     DATE OF RECORDING: 05/10/2017 REFERRING M.D.:  Geoffry Paradiseichard Aronson, MD Study Performed:   Baseline Polysomnogram HISTORY: 71 year old woman with a history of hypertension, mild chronic kidney disease, reflux disease, asthma, diverticulosis and overweight state, who was previously diagnosed with obstructive sleep apnea and placed on CPAP therapy. Her sleep study results from 2002 indicated mild obstructive sleep apnea, O2 nadir was 91%. She had no significant PLMS. Her snoring has become worse over time. It bothers her husband. She would consider using CPAP but also an oral appliance. The patient endorsed the Epworth Sleepiness Scale at 4 points. BMI of 29.4 kg/m2. The patient's neck circumference measured 13 3/8 inches.  CURRENT MEDICATIONS: Tylenol, Symbicort   PROCEDURE:  This is a multichannel digital polysomnogram utilizing the Somnostar 11.2 system.  Electrodes and sensors were applied and monitored per AASM Specifications.   EEG, EOG, Chin and Limb EMG, were sampled at 200 Hz.  ECG, Snore and Nasal Pressure, Thermal Airflow, Respiratory Effort, CPAP Flow and Pressure, Oximetry was sampled at 50 Hz. Digital video and audio were recorded.      BASELINE STUDY  Lights Out was at 22:23 and Lights On at 05:04.  Total recording time (TRT) was 401.5 minutes, with a total sleep time (TST) of  243 minutes.   The patient's sleep latency was 35.5 minutes, which is delayed. REM latency was 344 minutes which is markedly delayed. The sleep efficiency was 60.5%, which is reduced.     SLEEP ARCHITECTURE: WASO (Wake after sleep onset) was 136.5 minutes with moderate sleep fragmentation noted. There were 18 minutes in Stage N1, 144 minutes Stage N2, 48 minutes Stage N3 and 33 minutes in Stage REM.  The percentage of Stage N1 was 7.4%, Stage N2 was 59.3%, which is mildly increased, Stage N3 was 19.8%, which is normal and Stage R (REM sleep)  was 13.6%, which is reduced.  The arousals were noted as: 98 were spontaneous, 0 were associated with PLMs, 5 were associated with respiratory events.    Audio and video analysis did not show any abnormal or unusual movements, behaviors, phonations or vocalizations.  The patient took 1 bathroom break. Mild to moderate snoring was noted. The EKG was in keeping with normal sinus rhythm (NSR).  RESPIRATORY ANALYSIS:  There were a total of 9 respiratory events:  9 obstructive apneas, 0 central apneas and 0 mixed apneas with a total of 9 apneas and an apnea index (AI) of 2.2 /hour. There were 0 hypopneas with a hypopnea index of 0 /hour. The patient also had 0 respiratory event related arousals (RERAs).      The total APNEA/HYPOPNEA INDEX (AHI) was 2.2/hour and the total RESPIRATORY DISTURBANCE INDEX was 2.2 /hour.  2 events occurred in REM sleep and 0 events in NREM. The REM AHI was 3.6 /hour, versus a non-REM AHI of 2. The patient spent 8.5 minutes of total sleep time in the supine position and 235 minutes in non-supine. The supine AHI was 0.0 versus a non-supine AHI of 2.3.  OXYGEN SATURATION & C02:  The Wake baseline 02 saturation was 94%, with the lowest being 88%. Time spent below 89% saturation equaled 3 minutes.  PERIODIC LIMB MOVEMENTS: The patient had a total of 0 Periodic Limb Movements.  The Periodic Limb Movement (PLM) index was 0 and the PLM Arousal index was 0/hour.  Post-study, the patient  indicated that sleep was the same as usual.   IMPRESSION:  1. Primary Snoring 2. Dysfunctions associated with sleep stages or arousal from sleep 3. Repetitive Intrusions of Sleep  RECOMMENDATIONS:  1. This study does not demonstrate any significant obstructive or central sleep disordered breathing. Mild to moderate snoring was noted. For disturbing snoring, an oral appliance (through a qualified dentist) can be considered.  2. This study showed sleep fragmentation, spontaneous arousals and mildly  abnormal sleep stage percentages; these are nonspecific findings and per se do not signify an intrinsic sleep disorder or a cause for the patient's sleep-related symptoms. Causes include (but are not limited to) the first night effect of the sleep study, circadian rhythm disturbances, medication effect or an underlying mood disorder or medical problem.  3. The patient should be cautioned not to drive, work at heights, or operate dangerous or heavy equipment when tired or sleepy. Review and reiteration of good sleep hygiene measures should be pursued with any patient. 4. The will be encouraged to follow-up with her PCP, who will be notified of the test results. She can follow up in sleep clinic as needed.   I certify that I have reviewed the entire raw data recording prior to the issuance of this report in accordance with the Standards of Accreditation of the American Academy of Sleep Medicine (AASM)     Huston Foley, MD, PhD Diplomat, American Board of Psychiatry and Neurology (Neurology and Sleep Medicine)

## 2017-05-17 ENCOUNTER — Telehealth: Payer: Self-pay

## 2017-05-17 NOTE — Telephone Encounter (Signed)
I called pt to discuss her sleep study results. No answer, left a message asking her to call me back. 

## 2017-05-17 NOTE — Telephone Encounter (Signed)
-----   Message from Huston FoleySaima Athar, MD sent at 05/16/2017  9:02 AM EDT ----- Patient referred by Dr. Jacky KindleAronson, seen by me on 04/09/17, diagnostic PSG on 05/10/17. Please call and notify the patient that the recent sleep study did not show any significant obstructive sleep apnea or intrinsic sleep d/o. Mild to moderate snoring was noted. For disturbing snoring, an oral appliance (through a qualified dentist) can be considered. There was sleep fragmentation, spontaneous arousals and mildly abnormal sleep stage percentages; these are nonspecific findings. Slept about 60% of the time. Lot of wake time, but did achieve all stages of sleep.  Please remind patient to try to maintain good sleep hygiene, which means: Keep a regular sleep and wake schedule and make enough time for sleep (7 1/2 to 8 1/2 hours for the average adult), try not to exercise or have a meal within 2 hours of your bedtime, try to keep your bedroom conducive for sleep, that is, cool and dark, without light distractors such as an illuminated alarm clock, and refrain from watching TV right before sleep or in the middle of the night and do not keep the TV or radio on during the night. If a nightlight is used, have it away from the visual field. Also, try not to use or play on electronic devices at bedtime, such as your cell phone, tablet PC or laptop. If you like to read at bedtime on an electronic device, try to dim the background light as much as possible. Do not eat in the middle of the night. Keep pets away from the bedroom environment. For stress relief, try meditation, deep breathing exercises (there are many books and CDs available), a white noise machine or fan can help to diffuse other noise distractors, such as traffic noise. Do not drink alcohol before bedtime, as it can disturb sleep and cause middle of the night awakenings. Never mix alcohol and sedating medications! Avoid narcotic pain medication close to bedtime, as opioids/narcotics can suppress  breathing drive and breathing effort.   Please inform patient that she can FU with PCP. Also, route or fax report to PCP and referring MD, if other than PCP.  Once you have spoken to patient, you can close this encounter.   Thanks,  Huston FoleySaima Athar, MD, PhD [facility])

## 2017-05-21 NOTE — Telephone Encounter (Signed)
I called pt to discuss. Pt is unable to talk right now and will call me back when able.

## 2017-05-21 NOTE — Telephone Encounter (Signed)
Patient called office returning RN's call.  Please call °

## 2017-05-21 NOTE — Telephone Encounter (Signed)
I called pt. I advised pt that Dr. Frances FurbishAthar reviewed pt's sleep study and found that pt did not show any significant osa or an intrinsic sleep d/o. Mild to moderate snoringwas noted. I advised her that for disturbing snoring, she may consider an oral appliance made by a qualified dentist. There was sleep fragmentation, spontaneous arousals and mildly abnormal sleep stage percentages, but these were nonspecific findings. Pt only slept about 60% of the time and had a lot of wake time but did achieve all sleep stages. Dr. Frances FurbishAthar recommends that pt follow up with her PCP, Dr. Jacky KindleAronson. I reviewed sleep hygiene recommendations with the pt, including trying to keep a regular sleep wake schedule, avoiding electronics in the bedroom, keeping the bedroom cool, dark, and quiet, and avoiding eating or exercising within 2 hours of bedtime as well as eating in the middle of the night. I advised her to pursue stress relief such as meditation, deep breathing, and a white noise machine or fan might diffuse other noise distracters. I advised her to not drink alcohol before bed time and to never mix alcohol and sedating medications. Pt was advised to avoid narcotic pain medication close to bedtime. I advised pt that a copy of these sleep study results will be sent to Dr. Jacky KindleAronson. Pt verbalized understanding of results. Pt had no questions at this time but was encouraged to call back if questions arise.

## 2017-05-23 NOTE — Telephone Encounter (Signed)
Pt returned my call. She had questions about how much time she spent in each sleep stage. I advised her of this information. She wanted to know if Dr. Frances FurbishAthar will prescribe her a sleep aid and I advised her that Dr. Frances FurbishAthar would prefer that she talk to her PCP, and if PCP can't prescribe a sleep aid, then she might need to see a sleep psychologist.  Pt verbalized understanding and will discuss with Dr. Jacky KindleAronson. I spoke with Dr. Frances FurbishAthar about this and she is in agreement with this plan and recommendation.

## 2017-05-23 NOTE — Telephone Encounter (Signed)
I called pt to discuss. No answer, left a message asking her to call me back. 

## 2017-05-23 NOTE — Telephone Encounter (Signed)
Rn please call pt, she has some additional questions. She can be reached at [phone]

## 2017-05-23 NOTE — Telephone Encounter (Signed)
I called pt again to discuss. No answer, left a message asking her to call me back. Please skype me when this pt calls back.

## 2017-05-23 NOTE — Telephone Encounter (Signed)
Pt called back and is asking for a call when you are available

## 2017-09-18 ENCOUNTER — Telehealth (INDEPENDENT_AMBULATORY_CARE_PROVIDER_SITE_OTHER): Payer: Self-pay | Admitting: Physical Medicine and Rehabilitation

## 2017-09-18 NOTE — Telephone Encounter (Signed)
ok 

## 2017-09-18 NOTE — Telephone Encounter (Signed)
Scheduled for 10/02/17. 

## 2017-10-02 ENCOUNTER — Ambulatory Visit (INDEPENDENT_AMBULATORY_CARE_PROVIDER_SITE_OTHER): Payer: Medicare Other | Admitting: Physical Medicine and Rehabilitation

## 2017-10-02 ENCOUNTER — Ambulatory Visit (INDEPENDENT_AMBULATORY_CARE_PROVIDER_SITE_OTHER): Payer: Self-pay

## 2017-10-02 ENCOUNTER — Encounter (INDEPENDENT_AMBULATORY_CARE_PROVIDER_SITE_OTHER): Payer: Self-pay | Admitting: Physical Medicine and Rehabilitation

## 2017-10-02 DIAGNOSIS — M25551 Pain in right hip: Secondary | ICD-10-CM

## 2017-10-02 NOTE — Progress Notes (Signed)
Minus Anne Wilson - 71 y.o. female MRN [id number]  Date of birth: 05/07/1946  Office Visit Note: Visit Date: 10/02/2017 PCP: Geoffry ParadiseAronson, Richard, MD Referred by: Geoffry ParadiseAronson, Richard, MD  Subjective: Chief Complaint  Patient presents with  . Right Hip - Pain   HPI: Anne Wilson is a 71 year old female's and increasing pain in the right hip and groin.  We have completed diagnostic hip arthrogram in December 2017.  I also completed therapeutic hip injection with fluoroscopic guidance in May and she is done quite well with both of those injections.  She is followed by Dr. Cleophas DunkerWhitfield.  She has had no new issues or trauma just worsening similar pain.    ROS Otherwise per HPI.  Assessment & Plan: Visit Diagnoses:  1. Pain in right hip     Plan: Findings:  Diagnostic and hopefully therapeutic right intra-articular hip injection with fluoroscopic guidance.  The patient did have relief during the anesthetic phase.    Meds & Orders: No orders of the defined types were placed in this encounter.   Orders Placed This Encounter  Procedures  . Large Joint Inj: R hip joint  . XR C-ARM NO REPORT    Follow-up: Return if symptoms worsen or fail to improve.   Procedures: Large Joint Inj: R hip joint on 10/02/2017 3:36 PM Indications: diagnostic evaluation and pain Details: 22 G 3.5 in needle, fluoroscopy-guided anterior approach  Arthrogram: No  Medications: 80 mg triamcinolone acetonide 40 MG/ML; 3 mL bupivacaine 0.5 % Outcome: tolerated well, no immediate complications  There was excellent flow of contrast producing a partial arthrogram of the hip. The patient did have relief of symptoms during the anesthetic phase of the injection. Procedure, treatment alternatives, risks and benefits explained, specific risks discussed. Consent was given by the patient. Immediately prior to procedure a time out was called to verify the correct patient, procedure, equipment, support staff and site/side marked as  required. Patient was prepped and draped in the usual sterile fashion.      No notes on file   Clinical History: No specialty comments available.  She reports that  has never smoked. she has never used smokeless tobacco. No results for input(s): HGBA1C, LABURIC in the last 8760 hours.  Objective:  VS:  HT:    WT:   BMI:     BP:   HR: bpm  TEMP: ( )  RESP:  Physical Exam  Musculoskeletal:  Patient has painful range of motion of the right hip with internal rotation she has groin pain which is concordant.    Ortho Exam Imaging: No results found.  Past Medical/Family/Surgical/Social History: Medications & Allergies reviewed per EMR Patient Active Problem List   Diagnosis Date Noted  . DYSPNEA 01/06/2011   Past Medical History:  Diagnosis Date  . Asthma   . Diverticulosis   . GERD (gastroesophageal reflux disease)   . Mild sleep apnea    Family History  Problem Relation Age of Onset  . Coronary artery disease Father   . Heart failure Mother   . Emphysema Mother   . Lung cancer Brother    Past Surgical History:  Procedure Laterality Date  . RHINOPLASTY  1983  . TUBAL LIGATION  1984   Social History   Occupational History  . Occupation: Agricultural engineeraccountant    Employer: [employer]  Tobacco Use  . Smoking status: Never Smoker  . Smokeless tobacco: Never Used  Substance and Sexual Activity  . Alcohol use: Yes    Comment: social   .  Drug use: No  . Sexual activity: Not on file

## 2017-10-02 NOTE — Patient Instructions (Signed)

## 2017-10-02 NOTE — Progress Notes (Deleted)
Pt here for right hip injection, pt is having pain today, 3/10 pain scale, taking aleve for the pain, hurts worse when sitting or standing for long periods.   No driver, no allergy to contrast. No blood thinner.

## 2017-10-09 MED ORDER — TRIAMCINOLONE ACETONIDE 40 MG/ML IJ SUSP
80.0000 mg | INTRAMUSCULAR | Status: AC | PRN
  Administered 2017-10-02: 80 mg via INTRA_ARTICULAR

## 2017-10-09 MED ORDER — BUPIVACAINE HCL 0.5 % IJ SOLN
3.0000 mL | INTRAMUSCULAR | Status: AC | PRN
  Administered 2017-10-02: 3 mL via INTRA_ARTICULAR

## 2017-11-23 ENCOUNTER — Other Ambulatory Visit: Payer: Self-pay | Admitting: Family Medicine

## 2017-11-23 ENCOUNTER — Ambulatory Visit
Admission: RE | Admit: 2017-11-23 | Discharge: 2017-11-23 | Disposition: A | Discharge status: Home / Self Care | Payer: Medicare Other | Source: Ambulatory Visit | Attending: Family Medicine | Admitting: Family Medicine

## 2017-11-23 DIAGNOSIS — K921 Melena: Secondary | ICD-10-CM

## 2017-11-23 DIAGNOSIS — R197 Diarrhea, unspecified: Secondary | ICD-10-CM

## 2017-11-23 DIAGNOSIS — K5730 Diverticulosis of large intestine without perforation or abscess without bleeding: Secondary | ICD-10-CM

## 2017-11-23 DIAGNOSIS — R1084 Generalized abdominal pain: Secondary | ICD-10-CM

## 2017-11-23 MED ORDER — IOPAMIDOL (ISOVUE-300) INJECTION 61%
100.0000 mL | Freq: Once | INTRAVENOUS | Status: AC | PRN
  Administered 2017-11-23: 100 mL via INTRAVENOUS

## 2018-02-07 ENCOUNTER — Telehealth (INDEPENDENT_AMBULATORY_CARE_PROVIDER_SITE_OTHER): Payer: Self-pay | Admitting: Physical Medicine and Rehabilitation

## 2018-02-07 NOTE — Telephone Encounter (Signed)
Yes ok but will need f/up Whitfield prior to another.

## 2018-02-07 NOTE — Telephone Encounter (Signed)
Scheduled for 02/19/18 at 0930.

## 2018-02-19 ENCOUNTER — Ambulatory Visit (INDEPENDENT_AMBULATORY_CARE_PROVIDER_SITE_OTHER): Payer: Medicare Other

## 2018-02-19 ENCOUNTER — Ambulatory Visit (INDEPENDENT_AMBULATORY_CARE_PROVIDER_SITE_OTHER): Payer: Medicare Other | Admitting: Physical Medicine and Rehabilitation

## 2018-02-19 ENCOUNTER — Encounter (INDEPENDENT_AMBULATORY_CARE_PROVIDER_SITE_OTHER): Payer: Self-pay | Admitting: Physical Medicine and Rehabilitation

## 2018-02-19 DIAGNOSIS — M25551 Pain in right hip: Secondary | ICD-10-CM

## 2018-02-19 DIAGNOSIS — G8929 Other chronic pain: Secondary | ICD-10-CM

## 2018-02-19 DIAGNOSIS — M545 Low back pain: Secondary | ICD-10-CM

## 2018-02-19 DIAGNOSIS — W19XXXA Unspecified fall, initial encounter: Secondary | ICD-10-CM

## 2018-02-19 NOTE — Progress Notes (Signed)
PANZY BUBECK - 72 y.o. female MRN [id number]  Date of birth: [birth year]  Office Visit Note: Visit Date: 02/19/2018 PCP: Geoffry Paradise, MD Referred by: Geoffry Paradise, MD  Subjective: Chief Complaint  Patient presents with  . Right Hip - Pain  . Right Knee - Pain   HPI: Mrs. Runner is a very pleasant 72 year old female who continues to be active with known right hip joint osteoarthritis.  Arthritis is near end-stage.  Review of prior fluoroscopic images shows no significant worsening over time.  She is followed by Dr. Cleophas Dunker and we have completed several injections over the last year with each 1 lasting somewhere between 4-6 months.  She is here with worsening right hip and groin pain with no specific injury.  Going to repeat the injection today.  We discussed with her that she likely would benefit from total hip arthroplasty and she has talked with Dr. Cleophas Dunker about this.  She does report a fall recently but no specific injury.  She reports some worsening after the fall in general.  She says this was a very mild fall onto the buttock area.  She has not noted any radicular complaints or focal weakness.  She has had no red flag symptoms of weakness or bowel or bladder changes etc.  She has had no change in her health condition otherwise.  She does report getting some information from a friend or relative about a cocktail of alternative supplements for her hip pain that seem to make a dramatic difference in this patient.  She asked today about using vitamin D, omega-3, tumeric and tart cherry extract.  We did talk a bit about these at length.  I told her that obviously there is not great scientific evidence for the supplements however they are likely not detrimental.  There is some evidence for the tumeric.  Again I think is worth trying if she would like to try this but is not going to reestablish joint space of the arthritic hip.  There is no scientific evidence that is going to retard  the progress of arthritis.  Her pain is worse with ambulation and going from sit to stand and getting in and out of the car.  Is better at rest.   Likely prior to the next injection if her symptoms flareup she needs to follow-up with him for further evaluation and management.   Review of Systems  Constitutional: Negative for chills, fever, malaise/fatigue and weight loss.  HENT: Negative for hearing loss and sinus pain.   Eyes: Negative for blurred vision, double vision and photophobia.  Respiratory: Negative for cough and shortness of breath.   Cardiovascular: Negative for chest pain, palpitations and leg swelling.  Gastrointestinal: Negative for abdominal pain, nausea and vomiting.  Genitourinary: Negative for flank pain.  Musculoskeletal: Positive for falls and joint pain. Negative for myalgias.  Skin: Negative for itching and rash.  Neurological: Negative for tremors, focal weakness and weakness.  Endo/Heme/Allergies: Negative.   Psychiatric/Behavioral: Negative for depression.  All other systems reviewed and are negative.  Otherwise per HPI.  Assessment & Plan: Visit Diagnoses:  1. Pain in right hip   2. Chronic bilateral low back pain without sciatica   3. Fall, initial encounter     Plan: Findings:  Diagnostic and hopefully therapeutic anesthetic hip arthrogram.  Patient did have significant relief during the anesthetic portion of the injection.  Patient has significant also arthritis of the right hip.  She has some level of back and buttock  pain.  Recent fall seems to be fairly mild.  Exam does not show anything extraordinary.  She does have significant pain with hip rotation and this is been similar over time with her.  Fluoroscopic images do not show much in the way of progression although she has pretty significant hip osteoarthritis.  There is no fracture or dislocation.  Injection provided today with information regarding return to see Dr. Cleophas DunkerWhitfield for possible hip  replacement.  She reports that her husband is seeing Dr. Magnus IvanBlackman in the past and she may talk to him about hip issues as well.    Meds & Orders: No orders of the defined types were placed in this encounter.   Orders Placed This Encounter  Procedures  . Large Joint Inj: R hip joint  . XR C-ARM NO REPORT    Follow-up: Return if symptoms worsen or fail to improve.   Procedures: Large Joint Inj: R hip joint on 02/19/2018 9:28 AM Indications: pain and diagnostic evaluation Details: 22 G needle, anterior approach  Arthrogram: Yes  Medications: 80 mg triamcinolone acetonide 40 MG/ML; 3 mL bupivacaine 0.5 % Outcome: tolerated well, no immediate complications  Arthrogram demonstrated excellent flow of contrast throughout the joint surface without extravasation or obvious defect.  The patient had relief of symptoms during the anesthetic phase of the injection.  Procedure, treatment alternatives, risks and benefits explained, specific risks discussed. Consent was given by the patient. Immediately prior to procedure a time out was called to verify the correct patient, procedure, equipment, support staff and site/side marked as required. Patient was prepped and draped in the usual sterile fashion.      No notes on file   Clinical History: No specialty comments available.   She reports that she has never smoked. She has never used smokeless tobacco. No results for input(s): HGBA1C, LABURIC in the last 8760 hours.  Objective:  VS:  HT:    WT:   BMI:     BP:   HR: bpm  TEMP: ( )  RESP:  Physical Exam  Constitutional: She is oriented to person, place, and time. She appears well-developed and well-nourished.  Eyes: Pupils are equal, round, and reactive to light. Conjunctivae and EOM are normal.  Cardiovascular: Normal rate and intact distal pulses.  Pulmonary/Chest: Effort normal.  Abdominal: Soft. She exhibits no distension.  Musculoskeletal:  Painful range of motion of the right hip  particularly with internal rotation groin pain.  Left hip is asymptomatic.  Neurological: She is alert and oriented to person, place, and time. She exhibits normal muscle tone. Coordination normal.  Skin: Skin is warm and dry. No rash noted. No erythema.  Psychiatric: She has a normal mood and affect. Her behavior is normal.  Nursing note and vitals reviewed.   Ortho Exam Imaging: Xr C-arm No Report  Result Date: 02/19/2018 Please see Notes or Procedures tab for imaging impression.   Past Medical/Family/Surgical/Social History: Medications & Allergies reviewed per EMR, new medications updated. Patient Active Problem List   Diagnosis Date Noted  . DYSPNEA 01/06/2011   Past Medical History:  Diagnosis Date  . Asthma   . Diverticulosis   . GERD (gastroesophageal reflux disease)   . Mild sleep apnea    Family History  Problem Relation Age of Onset  . Coronary artery disease Father   . Heart failure Mother   . Emphysema Mother   . Lung cancer Brother    Past Surgical History:  Procedure Laterality Date  . RHINOPLASTY  1983  .  TUBAL LIGATION  1984   Social History   Occupational History  . Occupation: Agricultural engineer: [employer]  Tobacco Use  . Smoking status: Never Smoker  . Smokeless tobacco: Never Used  Substance and Sexual Activity  . Alcohol use: Yes    Comment: social   . Drug use: No  . Sexual activity: Not on file

## 2018-02-19 NOTE — Progress Notes (Signed)
 .  Numeric Pain Rating Scale and Functional Assessment Average Pain 6   In the last MONTH (on 0-10 scale) has pain interfered with the following?  1. General activity like being  able to carry out your everyday physical activities such as walking, climbing stairs, carrying groceries, or moving a chair?  Rating(3)   +Driver, -BT, -Dye Allergies.  

## 2018-02-19 NOTE — Patient Instructions (Signed)

## 2018-02-20 MED ORDER — BUPIVACAINE HCL 0.5 % IJ SOLN
3.0000 mL | INTRAMUSCULAR | Status: AC | PRN
  Administered 2018-02-19: 3 mL via INTRA_ARTICULAR

## 2018-02-20 MED ORDER — TRIAMCINOLONE ACETONIDE 40 MG/ML IJ SUSP
80.0000 mg | INTRAMUSCULAR | Status: AC | PRN
  Administered 2018-02-19: 80 mg via INTRA_ARTICULAR

## 2018-06-24 ENCOUNTER — Ambulatory Visit (INDEPENDENT_AMBULATORY_CARE_PROVIDER_SITE_OTHER): Payer: Medicare Other | Admitting: Orthopaedic Surgery

## 2018-06-24 ENCOUNTER — Encounter (INDEPENDENT_AMBULATORY_CARE_PROVIDER_SITE_OTHER): Payer: Self-pay | Admitting: Orthopaedic Surgery

## 2018-06-24 ENCOUNTER — Ambulatory Visit (INDEPENDENT_AMBULATORY_CARE_PROVIDER_SITE_OTHER): Payer: Medicare Other

## 2018-06-24 DIAGNOSIS — M1611 Unilateral primary osteoarthritis, right hip: Secondary | ICD-10-CM

## 2018-06-24 DIAGNOSIS — M25551 Pain in right hip: Secondary | ICD-10-CM

## 2018-06-24 NOTE — Progress Notes (Signed)
Office Visit Note   Patient: Anne Wilson           Date of Birth: 03/10/1946           MRN: [id number] Visit Date: 06/24/2018              Requested by: Geoffry ParadiseAronson, Richard, MD [address] [place], [place] [id number] PCP: Geoffry ParadiseAronson, Richard, MD   Assessment & Plan: Visit Diagnoses:  1. Pain in right hip   2. Unilateral primary osteoarthritis, right hip     Plan: At this point I do agree that a hip replacement surgery is warranted.  I showed her hip model and went over x-rays in detail.  I gave her handout on hip replacement surgery.  We had a long and thorough discussion about what the surgery involves.  We talked about her intraoperative and postoperative course.  The risk and benefits of surgery were discussed in detail.  All question concerns were answered and addressed.  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  Follow-Up Instructions: Return for 2 weeks post-op.   Orders:  Orders Placed This Encounter  Procedures  . XR HIP UNILAT W OR W/O PELVIS 1V RIGHT   No orders of the defined types were placed in this encounter.     Procedures: No procedures performed   Clinical Data: No additional findings.   Subjective: Chief Complaint  Patient presents with  . Right Hip - Pain  The patient is on seeing for the first time.  She is referred from other physicians and from Dr. Alvester MorinNewton with known severe osteoarthritis of her right hip.  She has had an intra-articular injection and that is temporize some of her pain.  This is with a steroid in the right hip.  Her pain is gone to be daily.  It is worse with activities.  She is been sitting for long period time and gets up to move she has a lot of pain in the groin on the right side.  She can cross her leg well due to pain.  It does wake her up at night.  She is tried and failed all conservative treatment measures and does wish to talk about total hip arthroplasty surgery.  Her pain can be daily.   It is detrimentally affected her activities daily living, her quality of life, and her mobility.  This is been going on for over a year now is been slowly getting worse.  She is work on activity modification and taking anti-inflammatories and work on weight loss.  She is work on hip strengthening exercises as well.  HPI  Review of Systems She currently denies any headache, chest pain, shortness of breath, fever, chills, nausea, vomiting.  Objective: Vital Signs: There were no vitals taken for this visit.  Physical Exam She is alert and oriented x3 and in no acute distress Ortho Exam Examination of her left hip is normal examination of her right painful hip shows almost no internal and external rotation due to stiffness and pain.  Her leg lengths are near equal. Specialty Comments:  No specialty comments available.  Imaging: Xr Hip Unilat W Or W/o Pelvis 1v Right  Result Date: 06/24/2018 An AP pelvis and lateral the right hip show severe end-stage arthritic changes in the right hip.  There is cystic changes in the femoral head as well as particular osteophytes and joint space narrowing.    PMFS History: Patient Active Problem List  Diagnosis Date Noted  . Unilateral primary osteoarthritis, right hip 06/24/2018  . DYSPNEA 01/06/2011   Past Medical History:  Diagnosis Date  . Asthma   . Diverticulosis   . GERD (gastroesophageal reflux disease)   . Mild sleep apnea     Family History  Problem Relation Age of Onset  . Coronary artery disease Father   . Heart failure Mother   . Emphysema Mother   . Lung cancer Brother     Past Surgical History:  Procedure Laterality Date  . RHINOPLASTY  1983  . TUBAL LIGATION  1984   Social History   Occupational History  . Occupation: Agricultural engineeraccountant    Employer: [employer]  Tobacco Use  . Smoking status: Never Smoker  . Smokeless tobacco: Never Used  Substance and Sexual Activity  . Alcohol use: Yes    Comment: social   . Drug  use: No  . Sexual activity: Not on file                                                                                                                                                                                                                                                                                                                                                                                                                                                                        +++++++++++++++++++++++++++++++++++++++++++++++++++++++++++++

## 2018-07-03 ENCOUNTER — Encounter (INDEPENDENT_AMBULATORY_CARE_PROVIDER_SITE_OTHER): Payer: Self-pay | Admitting: Orthopaedic Surgery

## 2018-07-07 IMAGING — CT CT ABD-PELV W/ CM
3 of 5 series · 13 of 36 positions shown, 19 images · IV contrast (WATER & [ID] ISOVUE 300)
Comparison: None.

CLINICAL DATA: Lower pelvic floor pain.  Severe diarrhea

EXAM:
CT ABDOMEN AND PELVIS WITH CONTRAST
TECHNIQUE: Multidetector CT imaging of the abdomen and pelvis was performed
using the standard protocol following bolus administration of
intravenous contrast.
CONTRAST:  100mL M5DUYY-NOO IOPAMIDOL (M5DUYY-NOO) INJECTION 61%

[Series 3: abd/pelvis with · axial · 0.87mm/px · z∈[-336,-36]mm · 6 of 86 slices shown, 11 images]
[im 13/86  soft-tissue]
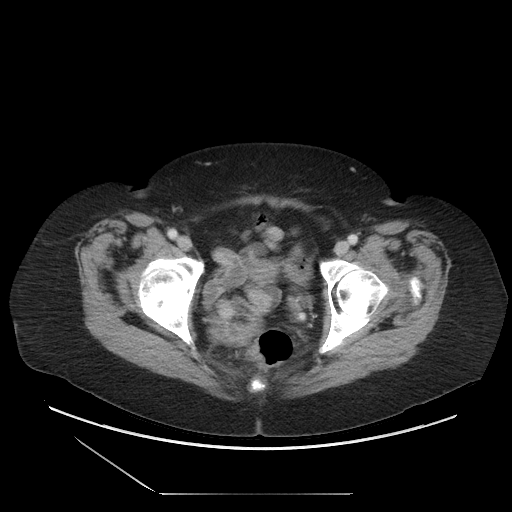
[im 13/86  bone]
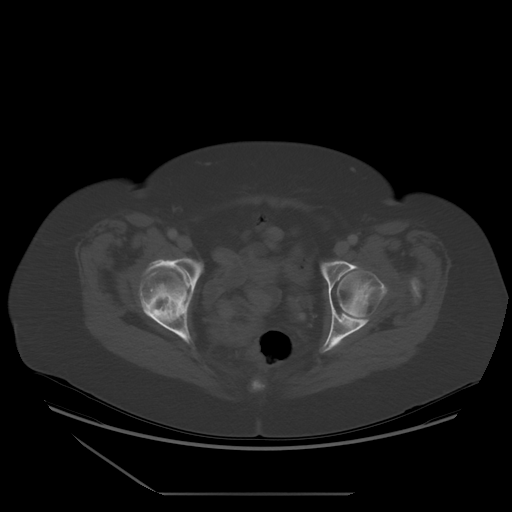
[im 25/86  soft-tissue]
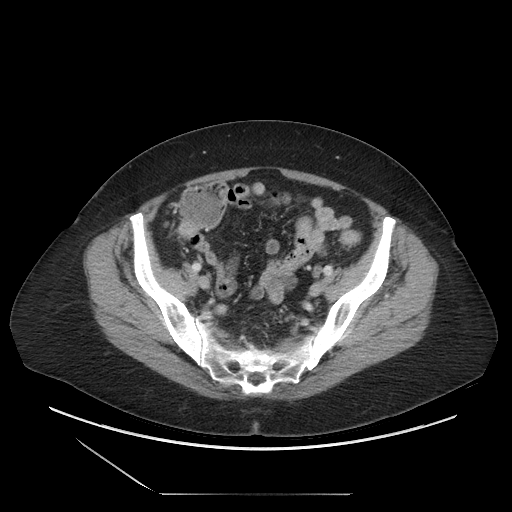
[im 37/86  soft-tissue]
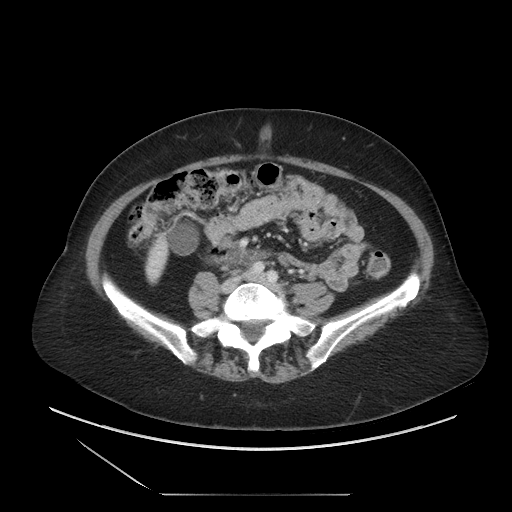
[im 37/86  lung]
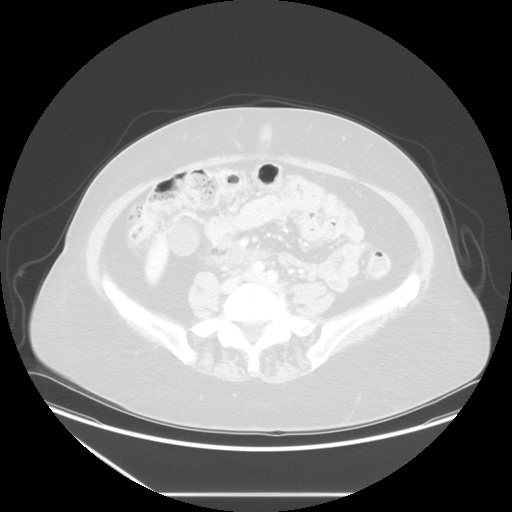
[im 49/86  soft-tissue]
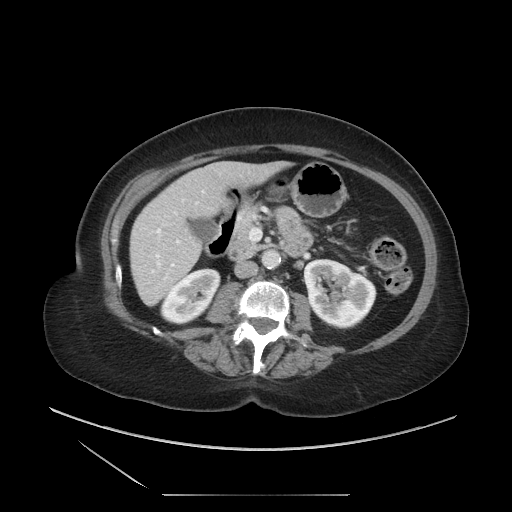
[im 49/86  lung]
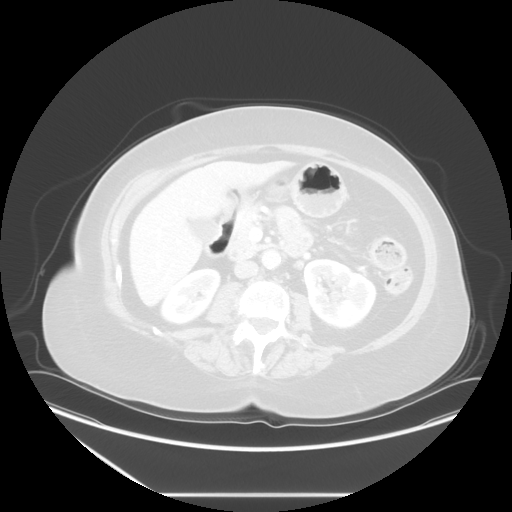
[im 61/86  soft-tissue]
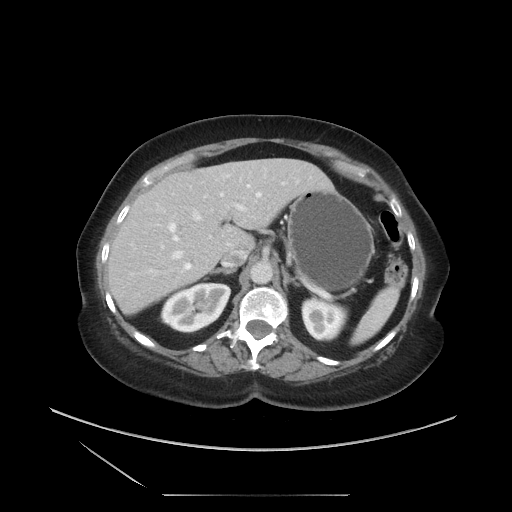
[im 61/86  lung]
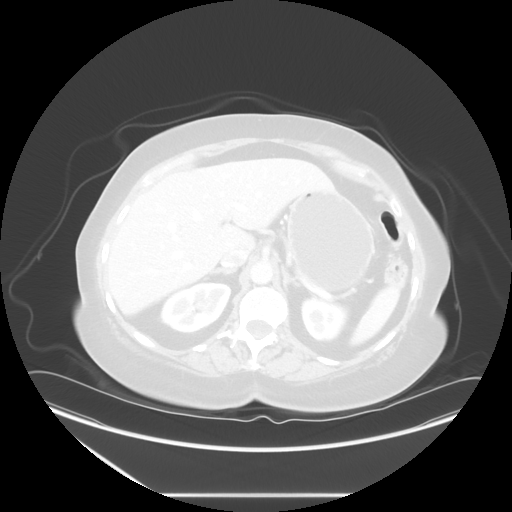
[im 73/86  soft-tissue]
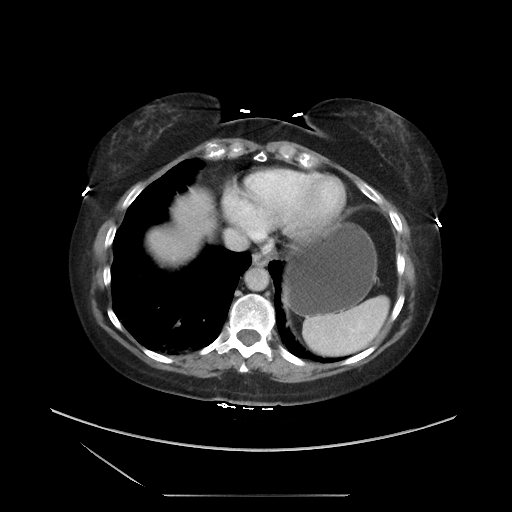
[im 73/86  lung]
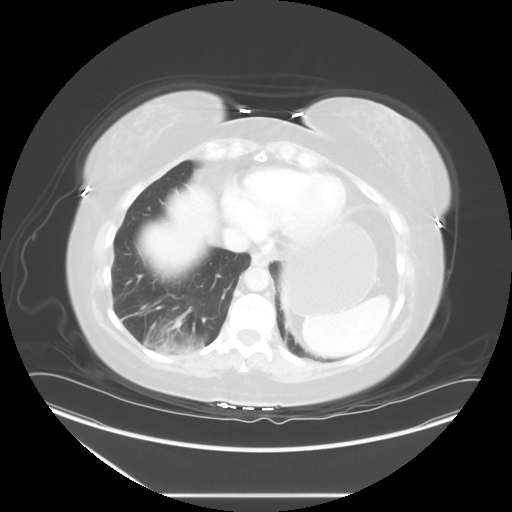

[Series 601: coronal body · coronal · 0.93mm/px · 1 of 126 slices shown, 2 images]
[im 42/126  soft-tissue]
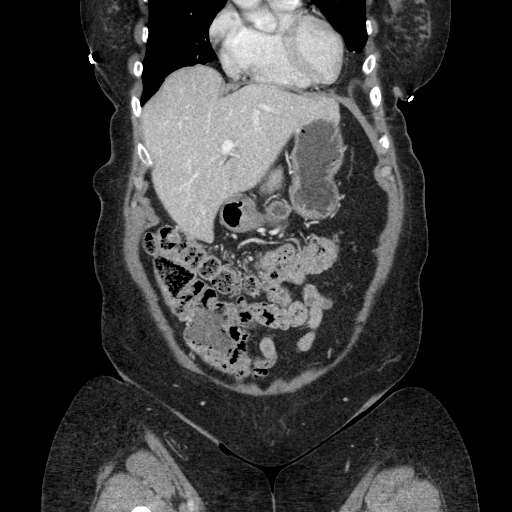
[im 42/126  bone]
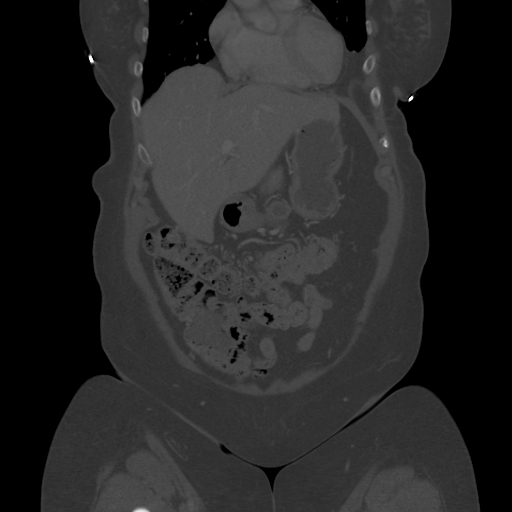

[Series 602: sagittal body · sagittal · 0.93mm/px · 6 of 180 slices shown]
[im 22/180  soft-tissue]
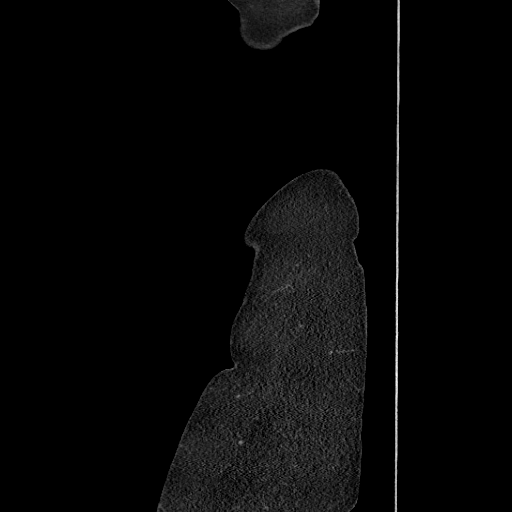
[im 43/180  soft-tissue]
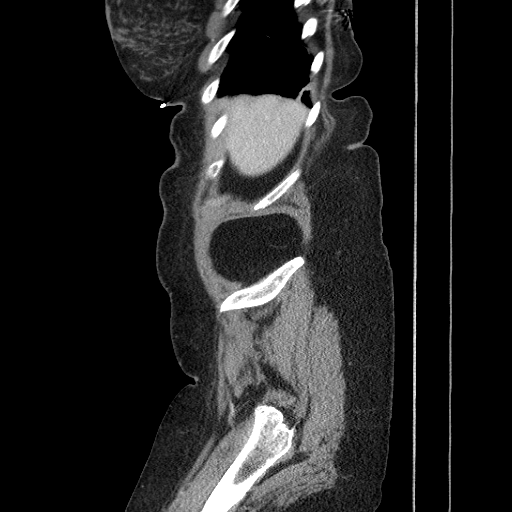
[im 64/180  soft-tissue]
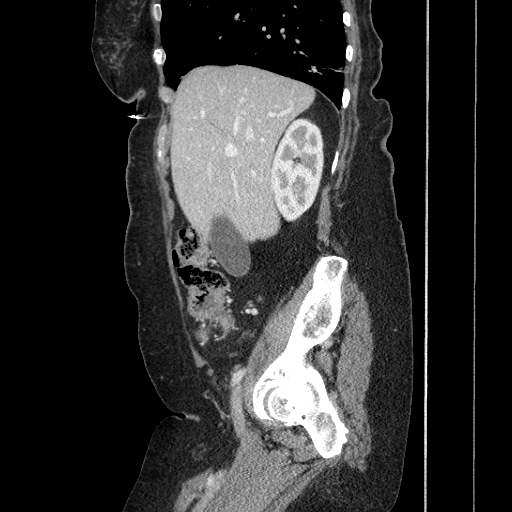
[im 85/180  soft-tissue]
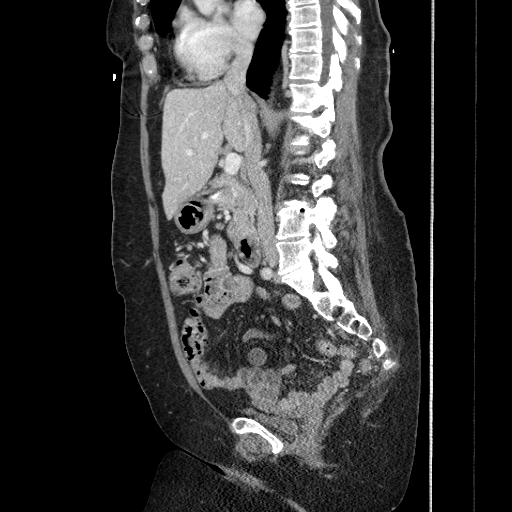
[im 106/180  soft-tissue]
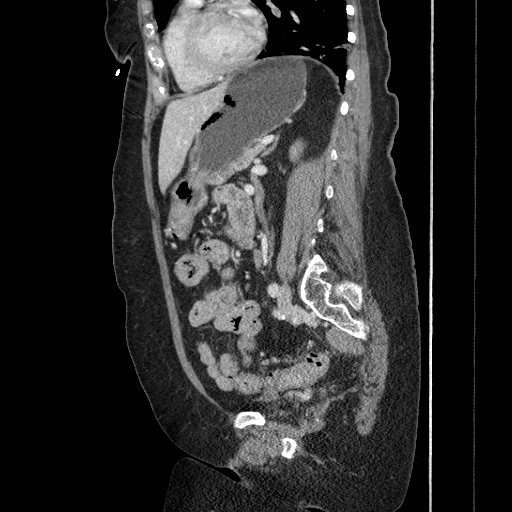
[im 127/180  soft-tissue]
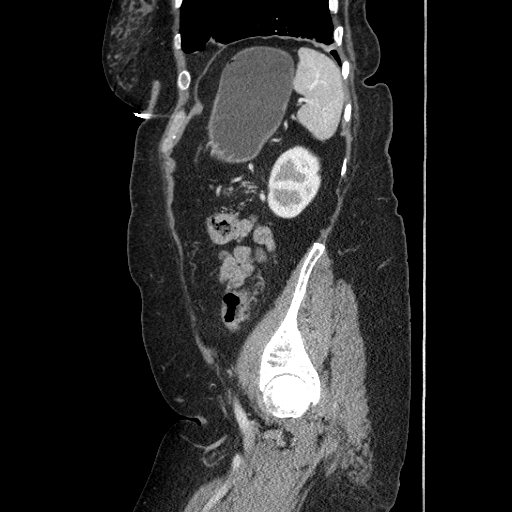

[13 of 36 positions shown; findings below may reference images not displayed]

FINDINGS: Lower chest: Lung bases are clear.  Bibasilar linear atelectasis.

Hepatobiliary: No focal hepatic lesion. No biliary duct dilatation.
Gallbladder is normal. Common bile duct is normal.

Pancreas: Pancreas is normal. No ductal dilatation. No pancreatic
inflammation.

Spleen: Normal spleen

Adrenals/urinary tract: Adrenal glands and kidneys are normal. The
ureters and bladder normal.

Stomach/Bowel: Stomach, small-bowel cecum normal. Appendix normal.
Ascending, transverse colon normal. There several diverticular the
descending colon without acute inflammation. Diverticula sigmoid
colon. Rectum normal.

Vascular/Lymphatic: Abdominal aorta is normal caliber with
atherosclerotic calcification. There is no retroperitoneal or
periportal lymphadenopathy. No pelvic lymphadenopathy.

Reproductive: Uterus is small.  No adnexal abnormality

Other: No free fluid.

Musculoskeletal: No aggressive osseous lesion.
IMPRESSION: 1. No inflammation or infection in abdomen pelvis.
2. LEFT colon diverticulosis without evidence diverticulitis
3.  Atherosclerotic calcification of the aorta.

## 2018-07-15 NOTE — Progress Notes (Signed)
Please place orders in Epic as patient is being scheduled for a pre-op appointment! Thank you! 

## 2018-07-16 NOTE — Patient Instructions (Addendum)
Anne Wilson  07/16/2018   Your procedure is scheduled on: 08-02-18  Report to [facility] Main  Entrance  Report to admitting at       0530 AM    Call this number if you have problems the morning of surgery [phone]   Remember: Do not eat food or drink liquids :After Midnight.     Take these medicines the morning of surgery with A SIP OF WATER: inhaler and bring, flonase                                You may not have any metal on your body including hair pins and              piercings  Do not wear jewelry, make-up, lotions, powders or perfumes, deodorant             Do not wear nail polish.  Do not shave  48 hours prior to surgery.     Do not bring valuables to the hospital. [facility] IS NOT             RESPONSIBLE   FOR VALUABLES.  Contacts, dentures or bridgework may not be worn into surgery.  Leave suitcase in the car. After surgery it may be brought to your room.               Please read over the following fact sheets you were given: _____________________________________________________________________           [facility] - Preparing for Surgery Before surgery, you can play an important role.  Because skin is not sterile, your skin needs to be as free of germs as possible.  You can reduce the number of germs on your skin by washing with CHG (chlorahexidine gluconate) soap before surgery.  CHG is an antiseptic cleaner which kills germs and bonds with the skin to continue killing germs even after washing. Please DO NOT use if you have an allergy to CHG or antibacterial soaps.  If your skin becomes reddened/irritated stop using the CHG and inform your nurse when you arrive at Short Stay. Do not shave (including legs and underarms) for at least 48 hours prior to the first CHG shower.  You may shave your face/neck. Please follow these instructions carefully:  1.  Shower with CHG Soap the night before surgery and the  morning of Surgery.  2.  If  you choose to wash your hair, wash your hair first as usual with your  normal  shampoo.  3.  After you shampoo, rinse your hair and body thoroughly to remove the  shampoo.                           4.  Use CHG as you would any other liquid soap.  You can apply chg directly  to the skin and wash                       Gently with a scrungie or clean washcloth.  5.  Apply the CHG Soap to your body ONLY FROM THE NECK DOWN.   Do not use on face/ open  Wound or open sores. Avoid contact with eyes, ears mouth and genitals (private parts).                       Wash face,  Genitals (private parts) with your normal soap.             6.  Wash thoroughly, paying special attention to the area where your surgery  will be performed.  7.  Thoroughly rinse your body with warm water from the neck down.  8.  DO NOT shower/wash with your normal soap after using and rinsing off  the CHG Soap.                9.  Pat yourself dry with a clean towel.            10.  Wear clean pajamas.            11.  Place clean sheets on your bed the night of your first shower and do not  sleep with pets. Day of Surgery : Do not apply any lotions/deodorants the morning of surgery.  Please wear clean clothes to the hospital/surgery center.  FAILURE TO FOLLOW THESE INSTRUCTIONS MAY RESULT IN THE CANCELLATION OF YOUR SURGERY PATIENT SIGNATURE_________________________________  NURSE SIGNATURE__________________________________  ________________________________________________________________________   Anne Wilson  An incentive spirometer is a tool that can help keep your lungs clear and active. This tool measures how well you are filling your lungs with each breath. Taking long deep breaths may help reverse or decrease the chance of developing breathing (pulmonary) problems (especially infection) following:  A long period of time when you are unable to move or be active. BEFORE THE PROCEDURE    If the spirometer includes an indicator to show your best effort, your nurse or respiratory therapist will set it to a desired goal.  If possible, sit up straight or lean slightly forward. Try not to slouch.  Hold the incentive spirometer in an upright position. INSTRUCTIONS FOR USE  1. Sit on the edge of your bed if possible, or sit up as far as you can in bed or on a chair. 2. Hold the incentive spirometer in an upright position. 3. Breathe out normally. 4. Place the mouthpiece in your mouth and seal your lips tightly around it. 5. Breathe in slowly and as deeply as possible, raising the piston or the ball toward the top of the column. 6. Hold your breath for 3-5 seconds or for as long as possible. Allow the piston or ball to fall to the bottom of the column. 7. Remove the mouthpiece from your mouth and breathe out normally. 8. Rest for a few seconds and repeat Steps 1 through 7 at least 10 times every 1-2 hours when you are awake. Take your time and take a few normal breaths between deep breaths. 9. The spirometer may include an indicator to show your best effort. Use the indicator as a goal to work toward during each repetition. 10. After each set of 10 deep breaths, practice coughing to be sure your lungs are clear. If you have an incision (the cut made at the time of surgery), support your incision when coughing by placing a pillow or rolled up towels firmly against it. Once you are able to get out of bed, walk around indoors and cough well. You may stop using the incentive spirometer when instructed by your caregiver.  RISKS AND COMPLICATIONS  Take your time so you do not get  dizzy or light-headed.  If you are in pain, you may need to take or ask for pain medication before doing incentive spirometry. It is harder to take a deep breath if you are having pain. AFTER USE  Rest and breathe slowly and easily.  It can be helpful to keep track of a log of your progress. Your caregiver  can provide you with a simple table to help with this. If you are using the spirometer at home, follow these instructions: [place] IF:   You are having difficultly using the spirometer.  You have trouble using the spirometer as often as instructed.  Your pain medication is not giving enough relief while using the spirometer.  You develop fever of 100.5 F (38.1 C) or higher. SEEK IMMEDIATE MEDICAL CARE IF:   You cough up bloody sputum that had not been present before.  You develop fever of 102 F (38.9 C) or greater.  You develop worsening pain at or near the incision site. MAKE SURE YOU:   Understand these instructions.  Will watch your condition.  Will get help right away if you are not doing well or get worse. Document Released: 03/05/2007 Document Revised: 01/15/2012 Document Reviewed: 05/06/2007 [facility] Patient Information 2014 [place], [place].   ________________________________________________________________________

## 2018-07-19 ENCOUNTER — Other Ambulatory Visit (INDEPENDENT_AMBULATORY_CARE_PROVIDER_SITE_OTHER): Payer: Self-pay | Admitting: Physician Assistant

## 2018-07-23 ENCOUNTER — Encounter (HOSPITAL_COMMUNITY): Payer: Self-pay

## 2018-07-23 ENCOUNTER — Encounter (HOSPITAL_COMMUNITY)
Admission: RE | Admit: 2018-07-23 | Discharge: 2018-07-23 | Disposition: A | Discharge status: Home / Self Care | Payer: Medicare Other | Source: Ambulatory Visit | Attending: Orthopaedic Surgery | Admitting: Orthopaedic Surgery

## 2018-07-23 ENCOUNTER — Other Ambulatory Visit: Payer: Self-pay

## 2018-07-23 DIAGNOSIS — Z01812 Encounter for preprocedural laboratory examination: Secondary | ICD-10-CM | POA: Diagnosis not present

## 2018-07-23 DIAGNOSIS — M1611 Unilateral primary osteoarthritis, right hip: Secondary | ICD-10-CM | POA: Insufficient documentation

## 2018-07-23 LAB — CBC
HEMATOCRIT: 44.4 % (ref 36.0–46.0)
HEMOGLOBIN: 14.1 g/dL (ref 12.0–15.0)
MCH: 29.7 pg (ref 26.0–34.0)
MCHC: 31.8 g/dL (ref 30.0–36.0)
MCV: 93.7 fL (ref 78.0–100.0)
Platelets: 344 10*3/uL (ref 150–400)
RBC: 4.74 MIL/uL (ref 3.87–5.11)
RDW: 13.4 % (ref 11.5–15.5)
WBC: 9.2 10*3/uL (ref 4.0–10.5)

## 2018-07-23 LAB — SURGICAL PCR SCREEN
MRSA, PCR: NEGATIVE
STAPHYLOCOCCUS AUREUS: POSITIVE — AB

## 2018-07-23 LAB — BASIC METABOLIC PANEL
ANION GAP: 9 (ref 5–15)
BUN: 24 mg/dL — ABNORMAL HIGH (ref 8–23)
CO2: 29 mmol/L (ref 22–32)
Calcium: 9.8 mg/dL (ref 8.9–10.3)
Chloride: 107 mmol/L (ref 98–111)
Creatinine, Ser: 0.74 mg/dL (ref 0.44–1.00)
GFR calc Af Amer: >60 mL/min (ref >60)
GFR calc non Af Amer: >60 mL/min (ref >60)
GLUCOSE: 103 mg/dL — AB (ref 70–99)
POTASSIUM: 4.3 mmol/L (ref 3.5–5.1)
Sodium: 145 mmol/L (ref 135–145)

## 2018-07-24 ENCOUNTER — Other Ambulatory Visit (INDEPENDENT_AMBULATORY_CARE_PROVIDER_SITE_OTHER): Payer: Self-pay

## 2018-08-01 ENCOUNTER — Encounter (HOSPITAL_COMMUNITY): Payer: Self-pay | Admitting: Anesthesiology

## 2018-08-01 NOTE — Anesthesia Preprocedure Evaluation (Addendum)
Anesthesia Evaluation  Patient identified by MRN, date of birth, ID band Patient awake    Reviewed: Allergy & Precautions, NPO status , Patient's Chart, lab work & pertinent test results  Airway Mallampati: II  TM Distance: >3 FB Neck ROM: Full    Dental no notable dental hx. (+) Teeth Intact   Pulmonary shortness of breath and with exertion, asthma , sleep apnea ,  Hx/o OSA not on CPAP, retested negative   Pulmonary exam normal breath sounds clear to auscultation       Cardiovascular negative cardio ROS Normal cardiovascular exam Rhythm:Regular Rate:Normal     Neuro/Psych negative neurological ROS  negative psych ROS   GI/Hepatic negative GI ROS, Neg liver ROS,   Endo/Other  negative endocrine ROS  Renal/GU negative Renal ROS  negative genitourinary   Musculoskeletal  (+) Arthritis , Osteoarthritis,  OA right hip   Abdominal   Peds  Hematology negative hematology ROS (+)   Anesthesia Other Findings   Reproductive/Obstetrics                            Anesthesia Physical Anesthesia Plan  ASA: II  Anesthesia Plan: Spinal   Post-op Pain Management:    Induction:   PONV Risk Score and Plan: 3 and Ondansetron, Propofol infusion and Treatment may vary due to age or medical condition  Airway Management Planned: Natural Airway, Nasal Cannula and Simple Face Mask  Additional Equipment:   Intra-op Plan:   Post-operative Plan:   Informed Consent: I have reviewed the patients History and Physical, chart, labs and discussed the procedure including the risks, benefits and alternatives for the proposed anesthesia with the patient or authorized representative who has indicated his/her understanding and acceptance.   Dental advisory given  Plan Discussed with: CRNA and Surgeon  Anesthesia Plan Comments:        Anesthesia Quick Evaluation

## 2018-08-02 ENCOUNTER — Inpatient Hospital Stay (HOSPITAL_COMMUNITY): Payer: Medicare Other

## 2018-08-02 ENCOUNTER — Encounter (HOSPITAL_COMMUNITY)
Admission: RE | Disposition: A | Discharge status: Home Health | Payer: Self-pay | Source: Ambulatory Visit | Attending: Orthopaedic Surgery

## 2018-08-02 ENCOUNTER — Other Ambulatory Visit: Payer: Self-pay

## 2018-08-02 ENCOUNTER — Inpatient Hospital Stay (HOSPITAL_COMMUNITY): Payer: Medicare Other | Admitting: Anesthesiology

## 2018-08-02 ENCOUNTER — Inpatient Hospital Stay (HOSPITAL_COMMUNITY)
Admission: RE | Admit: 2018-08-02 | Discharge: 2018-08-04 | DRG: 470 | Disposition: A | Discharge status: Home Health | Payer: Medicare Other | Source: Ambulatory Visit | Attending: Orthopaedic Surgery | Admitting: Orthopaedic Surgery

## 2018-08-02 ENCOUNTER — Encounter (HOSPITAL_COMMUNITY): Payer: Self-pay | Admitting: *Deleted

## 2018-08-02 DIAGNOSIS — Z7951 Long term (current) use of inhaled steroids: Secondary | ICD-10-CM | POA: Diagnosis not present

## 2018-08-02 DIAGNOSIS — Z96641 Presence of right artificial hip joint: Secondary | ICD-10-CM

## 2018-08-02 DIAGNOSIS — M1611 Unilateral primary osteoarthritis, right hip: Secondary | ICD-10-CM | POA: Diagnosis present

## 2018-08-02 DIAGNOSIS — Z7982 Long term (current) use of aspirin: Secondary | ICD-10-CM

## 2018-08-02 DIAGNOSIS — Z79899 Other long term (current) drug therapy: Secondary | ICD-10-CM

## 2018-08-02 DIAGNOSIS — J45909 Unspecified asthma, uncomplicated: Secondary | ICD-10-CM | POA: Diagnosis present

## 2018-08-02 DIAGNOSIS — M1990 Unspecified osteoarthritis, unspecified site: Secondary | ICD-10-CM

## 2018-08-02 DIAGNOSIS — G4730 Sleep apnea, unspecified: Secondary | ICD-10-CM | POA: Diagnosis present

## 2018-08-02 HISTORY — PX: TOTAL HIP ARTHROPLASTY: SHX124

## 2018-08-02 SURGERY — ARTHROPLASTY, HIP, TOTAL, ANTERIOR APPROACH
Anesthesia: General | Site: Hip | Laterality: Right

## 2018-08-02 MED ORDER — ROCURONIUM BROMIDE 10 MG/ML (PF) SYRINGE
PREFILLED_SYRINGE | INTRAVENOUS | Status: AC
  Filled 2018-08-02: qty 10

## 2018-08-02 MED ORDER — DOCUSATE SODIUM 100 MG PO CAPS
100.0000 mg | ORAL_CAPSULE | Freq: Two times a day (BID) | ORAL | Status: DC
  Administered 2018-08-02 – 2018-08-04 (×4): 100 mg via ORAL
  Filled 2018-08-02 (×4): qty 1

## 2018-08-02 MED ORDER — CEFAZOLIN SODIUM-DEXTROSE 2-4 GM/100ML-% IV SOLN
2.0000 g | INTRAVENOUS | Status: AC
  Administered 2018-08-02: 2 g via INTRAVENOUS
  Filled 2018-08-02: qty 100

## 2018-08-02 MED ORDER — MOMETASONE FURO-FORMOTEROL FUM 200-5 MCG/ACT IN AERO
2.0000 | INHALATION_SPRAY | Freq: Two times a day (BID) | RESPIRATORY_TRACT | Status: DC
  Filled 2018-08-02: qty 8.8

## 2018-08-02 MED ORDER — TRANEXAMIC ACID 1000 MG/10ML IV SOLN
INTRAVENOUS | Status: AC
  Filled 2018-08-02: qty 10

## 2018-08-02 MED ORDER — ACETAMINOPHEN 325 MG PO TABS
325.0000 mg | ORAL_TABLET | Freq: Four times a day (QID) | ORAL | Status: DC | PRN
  Administered 2018-08-02: 650 mg via ORAL
  Filled 2018-08-02: qty 2

## 2018-08-02 MED ORDER — VITAMIN D3 25 MCG (1000 UNIT) PO TABS
5000.0000 [IU] | ORAL_TABLET | Freq: Every day | ORAL | Status: DC
  Administered 2018-08-02 – 2018-08-04 (×3): 5000 [IU] via ORAL
  Filled 2018-08-02 (×3): qty 5

## 2018-08-02 MED ORDER — FENTANYL CITRATE (PF) 100 MCG/2ML IJ SOLN
INTRAMUSCULAR | Status: DC | PRN
  Administered 2018-08-02 (×2): 50 ug via INTRAVENOUS
  Administered 2018-08-02: 100 ug via INTRAVENOUS
  Administered 2018-08-02: 50 ug via INTRAVENOUS

## 2018-08-02 MED ORDER — ROCURONIUM BROMIDE 10 MG/ML (PF) SYRINGE
PREFILLED_SYRINGE | INTRAVENOUS | Status: DC | PRN
  Administered 2018-08-02 (×2): 50 mg via INTRAVENOUS

## 2018-08-02 MED ORDER — PROPOFOL 10 MG/ML IV BOLUS
INTRAVENOUS | Status: AC
  Filled 2018-08-02: qty 80

## 2018-08-02 MED ORDER — OXYCODONE HCL 5 MG PO TABS
10.0000 mg | ORAL_TABLET | ORAL | Status: DC | PRN
  Administered 2018-08-02 – 2018-08-03 (×3): 10 mg via ORAL
  Administered 2018-08-03: 5 mg via ORAL
  Administered 2018-08-04 (×2): 10 mg via ORAL
  Filled 2018-08-02: qty 2

## 2018-08-02 MED ORDER — ALUM & MAG HYDROXIDE-SIMETH 200-200-20 MG/5ML PO SUSP: 30.0000 mL | ORAL | Status: DC | PRN

## 2018-08-02 MED ORDER — ONDANSETRON HCL 4 MG/2ML IJ SOLN: 4.0000 mg | Freq: Four times a day (QID) | INTRAMUSCULAR | Status: DC | PRN

## 2018-08-02 MED ORDER — FENTANYL CITRATE (PF) 100 MCG/2ML IJ SOLN
INTRAMUSCULAR | Status: AC
  Filled 2018-08-02: qty 2

## 2018-08-02 MED ORDER — CEFAZOLIN SODIUM-DEXTROSE 1-4 GM/50ML-% IV SOLN
1.0000 g | Freq: Four times a day (QID) | INTRAVENOUS | Status: AC
  Administered 2018-08-02 (×2): 1 g via INTRAVENOUS
  Filled 2018-08-02 (×2): qty 50

## 2018-08-02 MED ORDER — MEPERIDINE HCL 50 MG/ML IJ SOLN: 6.2500 mg | INTRAMUSCULAR | Status: DC | PRN

## 2018-08-02 MED ORDER — DEXAMETHASONE SODIUM PHOSPHATE 10 MG/ML IJ SOLN
INTRAMUSCULAR | Status: DC | PRN
  Administered 2018-08-02: 10 mg via INTRAVENOUS

## 2018-08-02 MED ORDER — HYDROCODONE-ACETAMINOPHEN 7.5-325 MG PO TABS: 1.0000 | ORAL_TABLET | Freq: Once | ORAL | Status: DC | PRN

## 2018-08-02 MED ORDER — PROPOFOL 10 MG/ML IV BOLUS
INTRAVENOUS | Status: DC | PRN
  Administered 2018-08-02: 140 mg via INTRAVENOUS
  Administered 2018-08-02: 20 mg via INTRAVENOUS
  Administered 2018-08-02: 30 mg via INTRAVENOUS
  Administered 2018-08-02: 20 mg via INTRAVENOUS

## 2018-08-02 MED ORDER — ONDANSETRON HCL 4 MG/2ML IJ SOLN
INTRAMUSCULAR | Status: AC
  Filled 2018-08-02: qty 2

## 2018-08-02 MED ORDER — METHOCARBAMOL 500 MG IVPB - SIMPLE MED
INTRAVENOUS | Status: AC
  Filled 2018-08-02: qty 50

## 2018-08-02 MED ORDER — DEXAMETHASONE SODIUM PHOSPHATE 10 MG/ML IJ SOLN
INTRAMUSCULAR | Status: AC
  Filled 2018-08-02: qty 1

## 2018-08-02 MED ORDER — STERILE WATER FOR IRRIGATION IR SOLN
Status: DC | PRN
  Administered 2018-08-02: 2000 mL

## 2018-08-02 MED ORDER — PHENYLEPHRINE HCL 10 MG/ML IJ SOLN
INTRAMUSCULAR | Status: AC
  Filled 2018-08-02: qty 2

## 2018-08-02 MED ORDER — OXYCODONE HCL 5 MG PO TABS
5.0000 mg | ORAL_TABLET | ORAL | Status: DC | PRN
  Administered 2018-08-02 – 2018-08-03 (×2): 5 mg via ORAL
  Administered 2018-08-03 (×2): 10 mg via ORAL
  Filled 2018-08-02 (×3): qty 2
  Filled 2018-08-02 (×3): qty 1
  Filled 2018-08-02 (×2): qty 2
  Filled 2018-08-02 (×4): qty 1

## 2018-08-02 MED ORDER — PANTOPRAZOLE SODIUM 40 MG PO TBEC
40.0000 mg | DELAYED_RELEASE_TABLET | Freq: Every day | ORAL | Status: DC
  Administered 2018-08-02 – 2018-08-04 (×3): 40 mg via ORAL
  Filled 2018-08-02 (×3): qty 1

## 2018-08-02 MED ORDER — METOCLOPRAMIDE HCL 5 MG/ML IJ SOLN: 10.0000 mg | Freq: Once | INTRAMUSCULAR | Status: DC | PRN

## 2018-08-02 MED ORDER — TRANEXAMIC ACID 1000 MG/10ML IV SOLN
1000.0000 mg | INTRAVENOUS | Status: AC
  Administered 2018-08-02: 1000 mg via INTRAVENOUS

## 2018-08-02 MED ORDER — HYDROMORPHONE HCL 1 MG/ML IJ SOLN
INTRAMUSCULAR | Status: AC
  Filled 2018-08-02: qty 1

## 2018-08-02 MED ORDER — POLYETHYLENE GLYCOL 3350 17 G PO PACK: 17.0000 g | PACK | Freq: Every day | ORAL | Status: DC | PRN

## 2018-08-02 MED ORDER — SODIUM CHLORIDE 0.9 % IR SOLN
Status: DC | PRN
  Administered 2018-08-02: 1000 mL

## 2018-08-02 MED ORDER — MENTHOL 3 MG MT LOZG: 1.0000 | LOZENGE | OROMUCOSAL | Status: DC | PRN

## 2018-08-02 MED ORDER — CHLORHEXIDINE GLUCONATE 4 % EX LIQD: 60.0000 mL | Freq: Once | CUTANEOUS | Status: DC

## 2018-08-02 MED ORDER — ASPIRIN 81 MG PO CHEW
81.0000 mg | CHEWABLE_TABLET | Freq: Two times a day (BID) | ORAL | Status: DC
  Administered 2018-08-02 – 2018-08-04 (×4): 81 mg via ORAL
  Filled 2018-08-02 (×4): qty 1

## 2018-08-02 MED ORDER — ACETAMINOPHEN 10 MG/ML IV SOLN
INTRAVENOUS | Status: AC
  Filled 2018-08-02: qty 100

## 2018-08-02 MED ORDER — HYDROMORPHONE HCL 1 MG/ML IJ SOLN
0.2500 mg | INTRAMUSCULAR | Status: DC | PRN
  Administered 2018-08-02 (×2): 0.5 mg via INTRAVENOUS
  Administered 2018-08-02: 0.25 mg via INTRAVENOUS
  Administered 2018-08-02 (×3): 0.5 mg via INTRAVENOUS
  Administered 2018-08-02: 0.25 mg via INTRAVENOUS

## 2018-08-02 MED ORDER — ONDANSETRON HCL 4 MG PO TABS
4.0000 mg | ORAL_TABLET | Freq: Four times a day (QID) | ORAL | Status: DC | PRN
  Administered 2018-08-03: 4 mg via ORAL
  Filled 2018-08-02: qty 1

## 2018-08-02 MED ORDER — LIDOCAINE 2% (20 MG/ML) 5 ML SYRINGE
INTRAMUSCULAR | Status: AC
  Filled 2018-08-02: qty 5

## 2018-08-02 MED ORDER — SODIUM CHLORIDE 0.9 % IV SOLN
INTRAVENOUS | Status: DC
  Administered 2018-08-02 – 2018-08-03 (×2): via INTRAVENOUS

## 2018-08-02 MED ORDER — ONDANSETRON HCL 4 MG/2ML IJ SOLN
INTRAMUSCULAR | Status: DC | PRN
  Administered 2018-08-02: 4 mg via INTRAVENOUS

## 2018-08-02 MED ORDER — METHOCARBAMOL 500 MG PO TABS
500.0000 mg | ORAL_TABLET | Freq: Four times a day (QID) | ORAL | Status: DC | PRN
  Administered 2018-08-02 – 2018-08-04 (×6): 500 mg via ORAL
  Filled 2018-08-02 (×7): qty 1

## 2018-08-02 MED ORDER — METHOCARBAMOL 500 MG IVPB - SIMPLE MED
500.0000 mg | Freq: Four times a day (QID) | INTRAVENOUS | Status: DC | PRN
  Administered 2018-08-02: 500 mg via INTRAVENOUS
  Filled 2018-08-02: qty 50

## 2018-08-02 MED ORDER — METOCLOPRAMIDE HCL 5 MG/ML IJ SOLN: 5.0000 mg | Freq: Three times a day (TID) | INTRAMUSCULAR | Status: DC | PRN

## 2018-08-02 MED ORDER — METOCLOPRAMIDE HCL 5 MG PO TABS: 5.0000 mg | ORAL_TABLET | Freq: Three times a day (TID) | ORAL | Status: DC | PRN

## 2018-08-02 MED ORDER — PHENOL 1.4 % MT LIQD
1.0000 | OROMUCOSAL | Status: DC | PRN
  Filled 2018-08-02: qty 177

## 2018-08-02 MED ORDER — SUGAMMADEX SODIUM 200 MG/2ML IV SOLN
INTRAVENOUS | Status: DC | PRN
  Administered 2018-08-02: 400 mg via INTRAVENOUS

## 2018-08-02 MED ORDER — FLUTICASONE PROPIONATE 50 MCG/ACT NA SUSP
1.0000 | Freq: Every day | NASAL | Status: DC
  Administered 2018-08-02 – 2018-08-04 (×3): 1 via NASAL
  Filled 2018-08-02: qty 16

## 2018-08-02 MED ORDER — 0.9 % SODIUM CHLORIDE (POUR BTL) OPTIME
TOPICAL | Status: DC | PRN
  Administered 2018-08-02: 1000 mL

## 2018-08-02 MED ORDER — LACTATED RINGERS IV SOLN
INTRAVENOUS | Status: DC
  Administered 2018-08-02 (×2): via INTRAVENOUS

## 2018-08-02 MED ORDER — DIPHENHYDRAMINE HCL 12.5 MG/5ML PO ELIX: 12.5000 mg | ORAL_SOLUTION | ORAL | Status: DC | PRN

## 2018-08-02 MED ORDER — HYDROMORPHONE HCL 1 MG/ML IJ SOLN: 0.5000 mg | INTRAMUSCULAR | Status: DC | PRN

## 2018-08-02 MED ORDER — ACETAMINOPHEN 10 MG/ML IV SOLN
INTRAVENOUS | Status: DC | PRN
  Administered 2018-08-02: 1000 mg via INTRAVENOUS

## 2018-08-02 MED ORDER — LIDOCAINE HCL (CARDIAC) PF 100 MG/5ML IV SOSY
PREFILLED_SYRINGE | INTRAVENOUS | Status: DC | PRN
  Administered 2018-08-02: 60 mg via INTRAVENOUS

## 2018-08-02 SURGICAL SUPPLY — 39 items
APL SKNCLS STERI-STRIP NONHPOA (GAUZE/BANDAGES/DRESSINGS) ×1
BAG SPEC THK2 15X12 ZIP CLS (MISCELLANEOUS)
BAG ZIPLOCK 12X15 (MISCELLANEOUS) IMPLANT
BENZOIN TINCTURE PRP APPL 2/3 (GAUZE/BANDAGES/DRESSINGS) ×3 IMPLANT
BLADE SAW SGTL 18X1.27X75 (BLADE) ×2 IMPLANT
BLADE SAW SGTL 18X1.27X75MM (BLADE) ×1
CLOSURE WOUND 1/2 X4 (GAUZE/BANDAGES/DRESSINGS) ×1
COVER PERINEAL POST (MISCELLANEOUS) ×3 IMPLANT
COVER SURGICAL LIGHT HANDLE (MISCELLANEOUS) ×3 IMPLANT
CUP SECTOR GRIPTON 50MM (Cup) ×2 IMPLANT
DRAPE STERI IOBAN 125X83 (DRAPES) ×3 IMPLANT
DRAPE U-SHAPE 47X51 STRL (DRAPES) ×6 IMPLANT
DRSG AQUACEL AG ADV 3.5X10 (GAUZE/BANDAGES/DRESSINGS) ×3 IMPLANT
DURAPREP 26ML APPLICATOR (WOUND CARE) ×3 IMPLANT
ELECT REM PT RETURN 15FT ADLT (MISCELLANEOUS) ×3 IMPLANT
GAUZE XEROFORM 1X8 LF (GAUZE/BANDAGES/DRESSINGS) ×2 IMPLANT
GLOVE BIO SURGEON STRL SZ7.5 (GLOVE) ×3 IMPLANT
GLOVE BIOGEL PI IND STRL 8 (GLOVE) ×2 IMPLANT
GLOVE BIOGEL PI INDICATOR 8 (GLOVE) ×4
GLOVE ECLIPSE 8.0 STRL XLNG CF (GLOVE) ×3 IMPLANT
GOWN STRL REUS W/TWL XL LVL3 (GOWN DISPOSABLE) ×6 IMPLANT
HANDPIECE INTERPULSE COAX TIP (DISPOSABLE) ×3
HEAD FEM STD 32X+1 STRL (Hips) ×2 IMPLANT
HOLDER FOLEY CATH W/STRAP (MISCELLANEOUS) ×3 IMPLANT
LINER ACETABULAR 32X50 (Liner) ×2 IMPLANT
PACK ANTERIOR HIP CUSTOM (KITS) ×3 IMPLANT
SCREW 6.5MMX25MM (Screw) ×2 IMPLANT
SET HNDPC FAN SPRY TIP SCT (DISPOSABLE) ×1 IMPLANT
STAPLER VISISTAT 35W (STAPLE) IMPLANT
STEM CORAIL KLA11 (Stem) ×2 IMPLANT
STRIP CLOSURE SKIN 1/2X4 (GAUZE/BANDAGES/DRESSINGS) ×1 IMPLANT
SUT ETHIBOND NAB CT1 #1 30IN (SUTURE) ×3 IMPLANT
SUT MNCRL AB 4-0 PS2 18 (SUTURE) IMPLANT
SUT VIC AB 0 CT1 36 (SUTURE) ×3 IMPLANT
SUT VIC AB 1 CT1 36 (SUTURE) ×3 IMPLANT
SUT VIC AB 2-0 CT1 27 (SUTURE) ×6
SUT VIC AB 2-0 CT1 TAPERPNT 27 (SUTURE) ×2 IMPLANT
TRAY FOLEY MTR SLVR 16FR STAT (SET/KITS/TRAYS/PACK) ×3 IMPLANT
YANKAUER SUCT BULB TIP 10FT TU (MISCELLANEOUS) ×3 IMPLANT

## 2018-08-02 NOTE — Addendum Note (Signed)
Addendum  created 08/02/18 1101 by Mal Amabile, MD   SmartForm saved

## 2018-08-02 NOTE — Transfer of Care (Signed)
Immediate Anesthesia Transfer of Care Note  Patient: Anne Wilson  Procedure(s) Performed: RIGHT TOTAL HIP ARTHROPLASTY ANTERIOR APPROACH (Right Hip)  Patient Location: PACU  Anesthesia Type:General  Level of Consciousness: awake, alert , oriented and patient cooperative  Airway & Oxygen Therapy: Patient Spontanous Breathing and Patient connected to face mask oxygen  Post-op Assessment: Report given to RN, Post -op Vital signs reviewed and stable and Patient moving all extremities  Post vital signs: Reviewed and stable  Last Vitals:  Vitals Value Taken Time  BP 131/77 08/02/2018  9:06 AM  Temp    Pulse 80 08/02/2018  9:09 AM  Resp 13 08/02/2018  9:09 AM  SpO2 100 % 08/02/2018  9:09 AM  Vitals shown include unvalidated device data.  Last Pain:  Vitals:   08/02/18 0602  TempSrc:   PainSc: 4       Patients Stated Pain Goal: 4 (08/02/18 0602)  Complications: No apparent anesthesia complications

## 2018-08-02 NOTE — Evaluation (Signed)
Physical Therapy Evaluation Patient Details Name: Anne Wilson MRN: [id number] DOB: 07-21-1946 Today's Date: 08/02/2018   History of Present Illness  s/p R DA THA  Clinical Impression  Pt is s/p THA resulting in the deficits listed below (see PT Problem List). Pt will benefit from skilled PT to increase their independence and safety with mobility to allow discharge to the venue listed below.      Follow Up Recommendations Follow surgeon's recommendation for DC plan and follow-up therapies    Equipment Recommendations  None recommended by PT    Recommendations for Other Services       Precautions / Restrictions Precautions Precautions: Fall Restrictions Weight Bearing Restrictions: No Other Position/Activity Restrictions: WBAT      Mobility  Bed Mobility Overal bed mobility: Needs Assistance Bed Mobility: Supine to Sit     Supine to sit: Min assist     General bed mobility comments: min assist with  RLE  Transfers Overall transfer level: Needs assistance Equipment used: Rolling walker (2 wheeled) Transfers: Sit to/from Stand Sit to Stand: Min assist         General transfer comment: cues for hand placement and LE management  Ambulation/Gait Ambulation/Gait assistance: Min guard Gait Distance (Feet): 65 Feet Assistive device: Rolling walker (2 wheeled) Gait Pattern/deviations: Step-to pattern;Step-through pattern     General Gait Details: cues for sequence and RW position  Stairs            Wheelchair Mobility    Modified Rankin (Stroke Patients Only)       Balance                                             Pertinent Vitals/Pain Pain Assessment: No/denies pain    Home Living Family/patient expects to be discharged to:: Private residence Living Arrangements: Spouse/significant other Available Help at Discharge: Family;Available 24 hours/day Type of Home: House Home Access: Stairs to enter Entrance Stairs-Rails:  None Entrance Stairs-Number of Steps: 2 Home Layout: One level Home Equipment: Walker - 2 wheels;Bedside commode      Prior Function Level of Independence: Independent               Hand Dominance        Extremity/Trunk Assessment   Upper Extremity Assessment Upper Extremity Assessment: Overall WFL for tasks assessed    Lower Extremity Assessment Lower Extremity Assessment: RLE deficits/detail RLE Deficits / Details: ankle WFL; knee and hip limited by post op pain        Communication   Communication: No difficulties  Cognition Arousal/Alertness: Awake/alert Behavior During Therapy: WFL for tasks assessed/performed Overall Cognitive Status: Within Functional Limits for tasks assessed                                        General Comments      Exercises Total Joint Exercises Ankle Circles/Pumps: AROM;Both;10 reps Quad Sets: AROM;Both;10 reps Heel Slides: AAROM;10 reps;Right   Assessment/Plan    PT Assessment Patient needs continued PT services  PT Problem List Decreased strength;Decreased activity tolerance;Decreased mobility;Pain;Decreased knowledge of use of DME;Decreased range of motion       PT Treatment Interventions DME instruction;Gait training;Functional mobility training;Stair training;Therapeutic activities;Patient/family education;Therapeutic exercise    PT Goals (Current goals can be found in the  Care Plan section)  Acute Rehab PT Goals PT Goal Formulation: With patient Time For Goal Achievement: 08/09/18 Potential to Achieve Goals: Good    Frequency 7X/week   Barriers to discharge        Co-evaluation               AM-PAC PT "6 Clicks" Daily Activity  Outcome Measure Difficulty turning over in bed (including adjusting bedclothes, sheets and blankets)?: Unable Difficulty moving from lying on back to sitting on the side of the bed? : Unable Difficulty sitting down on and standing up from a chair with arms  (e.g., wheelchair, bedside commode, etc,.)?: Unable Help needed moving to and from a bed to chair (including a wheelchair)?: A Little Help needed walking in hospital room?: A Little Help needed climbing 3-5 steps with a railing? : A Little 6 Click Score: 12    End of Session Equipment Utilized During Treatment: Gait belt Activity Tolerance: Patient tolerated treatment well Patient left: with call bell/phone within reach;in chair;with family/visitor present   PT Visit Diagnosis: Difficulty in walking, not elsewhere classified (R26.2)    Time: 1610-9604 PT Time Calculation (min) (ACUTE ONLY): 27 min   Charges:   PT Evaluation $PT Eval Low Complexity: 1 Low PT Treatments $Gait Training: 8-22 mins        Drucilla Chalet, PT Pager: [phone] 08/02/2018   [facility] Acute Rehab Dept [phone]   [facility] 08/02/2018, 6:39 PM

## 2018-08-02 NOTE — H&P (Signed)
TOTAL HIP ADMISSION H&P  Patient is admitted for right total hip arthroplasty.  Subjective:  Chief Complaint: right hip pain  HPI: Anne Wilson, 72 y.o. female, has a history of pain and functional disability in the right hip(s) due to arthritis and patient has failed non-surgical conservative treatments for greater than 12 weeks to include NSAID's and/or analgesics, corticosteriod injections, flexibility and strengthening excercises and activity modification.  Onset of symptoms was gradual starting 2 years ago with gradually worsening course since that time.The patient noted no past surgery on the right hip(s).  Patient currently rates pain in the right hip at 10 out of 10 with activity. Patient has night pain, worsening of pain with activity and weight bearing, pain that interfers with activities of daily living and pain with passive range of motion. Patient has evidence of subchondral cysts, subchondral sclerosis, periarticular osteophytes and joint space narrowing by imaging studies. This condition presents safety issues increasing the risk of falls.  There is no current active infection.  Patient Active Problem List   Diagnosis Date Noted  . Unilateral primary osteoarthritis, right hip 06/24/2018  . DYSPNEA 01/06/2011   Past Medical History:  Diagnosis Date  . Asthma   . Diverticulosis   . Mild sleep apnea    When retested 2019 at sleep center in [facility] negative    Past Surgical History:  Procedure Laterality Date  . JOINT REPLACEMENT     Right hip Dr. Allie Bossier 08-02-18  . RHINOPLASTY  1983  . TUBAL LIGATION  1984    Current Facility-Administered Medications  Medication Dose Route Frequency Provider Last Rate Last Dose  . ceFAZolin (ANCEF) IVPB 2g/100 mL premix  2 g Intravenous On Call to OR Kirtland Bouchard, PA-C      . chlorhexidine (HIBICLENS) 4 % liquid 4 application  60 mL Topical Once Kirtland Bouchard, PA-C      . lactated ringers infusion   Intravenous  Continuous Shelton Silvas, MD 20 mL/hr at 08/02/18 [phone]     No Known Allergies  Social History   Tobacco Use  . Smoking status: Never Smoker  . Smokeless tobacco: Never Used  Substance Use Topics  . Alcohol use: Yes    Comment: social     Family History  Problem Relation Age of Onset  . Coronary artery disease Father   . Heart failure Mother   . Emphysema Mother   . Lung cancer Brother      Review of Systems  Musculoskeletal: Positive for joint pain.  All other systems reviewed and are negative.   Objective:  Physical Exam  Constitutional: She is oriented to person, place, and time. She appears well-developed and well-nourished.  HENT:  Head: Normocephalic and atraumatic.  Eyes: Pupils are equal, round, and reactive to light. EOM are normal.  Neck: Normal range of motion. Neck supple.  Cardiovascular: Normal rate and regular rhythm.  Respiratory: Effort normal and breath sounds normal.  GI: Soft. Bowel sounds are normal.  Musculoskeletal:       Right hip: She exhibits decreased range of motion, decreased strength, tenderness and bony tenderness.  Neurological: She is alert and oriented to person, place, and time.  Skin: Skin is warm and dry.  Psychiatric: She has a normal mood and affect.    Vital signs in last 24 hours: Temp:  [97.9 F (36.6 C)] 97.9 F (36.6 C) (09/27 0545) Pulse Rate:  [73] 73 (09/27 0545) Resp:  [18] 18 (09/27 0545) BP: (143)/(82) 143/82 (09/27  0545) SpO2:  [96 %] 96 % (09/27 0545) Weight:  [84.4 kg] 84.4 kg (09/27 0602)  Labs:   Estimated body mass index is 30.02 kg/m as calculated from the following:   Height as of this encounter: 5\' 6"  (1.676 m).   Weight as of this encounter: 84.4 kg.   Imaging Review Plain radiographs demonstrate severe degenerative joint disease of the right hip(s). The bone quality appears to be excellent for age and reported activity level.    Preoperative templating of the joint replacement has been  completed, documented, and submitted to the Operating Room personnel in order to optimize intra-operative equipment management.     Assessment/Plan:  End stage arthritis, right hip(s)  The patient history, physical examination, clinical judgement of the provider and imaging studies are consistent with end stage degenerative joint disease of the right hip(s) and total hip arthroplasty is deemed medically necessary. The treatment options including medical management, injection therapy, arthroscopy and arthroplasty were discussed at length. The risks and benefits of total hip arthroplasty were presented and reviewed. The risks due to aseptic loosening, infection, stiffness, dislocation/subluxation,  thromboembolic complications and other imponderables were discussed.  The patient acknowledged the explanation, agreed to proceed with the plan and consent was signed. Patient is being admitted for inpatient treatment for surgery, pain control, PT, OT, prophylactic antibiotics, VTE prophylaxis, progressive ambulation and ADL's and discharge planning.The patient is planning to be discharged home with home health services

## 2018-08-02 NOTE — Anesthesia Postprocedure Evaluation (Signed)
Anesthesia Post Note  Patient: Anne Wilson  Procedure(s) Performed: RIGHT TOTAL HIP ARTHROPLASTY ANTERIOR APPROACH (Right Hip)     Patient location during evaluation: PACU Anesthesia Type: General Level of consciousness: awake and alert and oriented Pain management: pain level controlled Vital Signs Assessment: post-procedure vital signs reviewed and stable Respiratory status: spontaneous breathing, nonlabored ventilation and respiratory function stable Cardiovascular status: blood pressure returned to baseline and stable Postop Assessment: no apparent nausea or vomiting Anesthetic complications: no Comments: Multiple attempts at SAB unsuccessful, so case done under GETA.    Last Vitals:  Vitals:   08/02/18 0945 08/02/18 1000  BP: (!) 133/92 123/70  Pulse: 72 73  Resp: 13 14  Temp:    SpO2: 100% 95%    Last Pain:  Vitals:   08/02/18 1000  TempSrc:   PainSc: 8                  Anisa Leanos A.

## 2018-08-02 NOTE — Anesthesia Procedure Notes (Signed)
Procedure Name: Intubation Date/Time: 08/02/2018 7:35 AM Performed by: Mitzie Na, CRNA Pre-anesthesia Checklist: Patient identified, Emergency Drugs available, Suction available, Patient being monitored and Timeout performed Patient Re-evaluated:Patient Re-evaluated prior to induction Oxygen Delivery Method: Circle system utilized Preoxygenation: Pre-oxygenation with 100% oxygen Induction Type: IV induction Ventilation: Mask ventilation without difficulty Laryngoscope Size: Mac and 3 Grade View: Grade I Tube type: Oral Tube size: 7.0 mm Number of attempts: 1 Airway Equipment and Method: Stylet Placement Confirmation: ETT inserted through vocal cords under direct vision,  positive ETCO2 and breath sounds checked- equal and bilateral Secured at: 21 cm Tube secured with: Tape Dental Injury: Teeth and Oropharynx as per pre-operative assessment

## 2018-08-02 NOTE — Plan of Care (Signed)
  Problem: Education: Goal: Knowledge of General Education information will improve Description Including pain rating scale, medication(s)/side effects and non-pharmacologic comfort measures Outcome: Progressing   Problem: Health Behavior/Discharge Planning: Goal: Ability to manage health-related needs will improve Outcome: Progressing   Problem: Clinical Measurements: Goal: Ability to maintain clinical measurements within normal limits will improve Outcome: Progressing Goal: Will remain free from infection Outcome: Progressing Goal: Cardiovascular complication will be avoided Outcome: Progressing   Problem: Activity: Goal: Risk for activity intolerance will decrease Outcome: Progressing   Problem: Nutrition: Goal: Adequate nutrition will be maintained Outcome: Progressing   Problem: Coping: Goal: Level of anxiety will decrease Outcome: Progressing   Problem: Elimination: Goal: Will not experience complications related to bowel motility Outcome: Progressing Goal: Will not experience complications related to urinary retention Outcome: Progressing   Problem: Pain Managment: Goal: General experience of comfort will improve Outcome: Progressing   Problem: Safety: Goal: Ability to remain free from injury will improve Outcome: Progressing   Problem: Skin Integrity: Goal: Risk for impaired skin integrity will decrease Outcome: Progressing   Problem: Education: Goal: Knowledge of the prescribed therapeutic regimen will improve Outcome: Progressing Goal: Understanding of discharge needs will improve Outcome: Progressing Goal: Individualized Educational Video(s) Outcome: Progressing   Problem: Activity: Goal: Ability to avoid complications of mobility impairment will improve Outcome: Progressing Goal: Ability to tolerate increased activity will improve Outcome: Progressing   Problem: Clinical Measurements: Goal: Postoperative complications will be avoided or  minimized Outcome: Progressing   Problem: Pain Management: Goal: Pain level will decrease with appropriate interventions Outcome: Progressing   Problem: Skin Integrity: Goal: Will show signs of wound healing Outcome: Progressing

## 2018-08-02 NOTE — Plan of Care (Signed)
  Problem: Health Behavior/Discharge Planning: Goal: Ability to manage health-related needs will improve Outcome: Progressing   Problem: Clinical Measurements: Goal: Ability to maintain clinical measurements within normal limits will improve Outcome: Progressing   Problem: Clinical Measurements: Goal: Will remain free from infection Outcome: Progressing   Problem: Clinical Measurements: Goal: Diagnostic test results will improve Outcome: Progressing   

## 2018-08-02 NOTE — Care Management Note (Signed)
Case Management Note  Patient Details  Name: Anne Wilson MRN: [id number] Date of Birth: 11-07-1945  Subjective/Objective:    Discharge planning, spoke with patient and spouse at bedside. Have chosen [facility] for [facility] PT, evaluate and treat.   Action/Plan: [facility] for referral. Has DME. [phone]                Expected Discharge Date:                  Expected Discharge Plan:  Home w Home Health Services  In-House Referral:  NA  Discharge planning Services  CM Consult  Post Acute Care Choice:  Home Health Choice offered to:  Patient  DME Arranged:  N/A DME Agency:  NA  HH Arranged:  PT HH Agency:  [facility] ([facility]  Status of Service:  Completed, signed off  If discussed at [employer], dates discussed:    Additional Comments:  Alexis Goodell, RN 08/02/2018, 3:31 PM

## 2018-08-02 NOTE — Op Note (Signed)
NAME: Anne Wilson, Anne Wilson MEDICAL RECORD [id number] ACCOUNT [id number] DATE OF BIRTH:10/16/1946 FACILITY: [facility] LOCATION: [facility] PHYSICIAN:Miyanna Wiersma Aretha Parrot, MD  OPERATIVE REPORT  DATE OF PROCEDURE:  08/02/2018  PREOPERATIVE DIAGNOSIS:  Significant osteoarthritis and degenerative joint disease, right hip.  POSTOPERATIVE DIAGNOSIS:  Significant osteoarthritis and degenerative joint disease, right hip.  PROCEDURE:  Right total hip arthroplasty through direct anterior approach.  IMPLANTS:  DePuy Sector Gription acetabular component size 50, with a single screw, size 32+0 polyethylene liner, size 11 Corail femoral component with standard offset, with varus offset, size 32+1 metal hip ball.  SURGEON:  Vanita Panda. Magnus Ivan, MD  ASSISTANT:  Richardean Canal, PA-C.  ANESTHESIA:   1.  Attempted spinal. 2.  General.  ANTIBIOTICS:  Two grams IV Ancef.  ESTIMATED BLOOD LOSS:  500 mL.  COMPLICATIONS:  None.  INDICATIONS:  The patient is a 72 year old female well known to me.  She has developed debilitating arthritis involving her right hip.  This is confirmed on x-rays and clinical exam.  At this point, her pain is detrimentally affected her activities of  daily living, quality of life, and her mobility to the point that she does wish to proceed with a total hip arthroplasty.  She understands with this surgery, there are risks of acute blood loss anemia, nerve or vessel injury, fracture, infection, DVT and  dislocation and implant failure.  There are the goals of decreased pain, improve mobility and overall improve quality of life.  DESCRIPTION OF PROCEDURE:  After informed consent was obtained and appropriate right hip was marked.  She was brought to the operating room and sat up on a stretcher.  They attempted spinal anesthesia which they were unsuccessful.  She was then laid in  the supine position.  General anesthesia was then obtained.  A Foley catheter was placed and traction  boots were placed on both her feet.  Next, she was placed supine on the Hana fracture table, perineal post in place and both legs in line skeletal  traction device and no traction applied.  Of note, preoperatively I felt that her leg lengths are close to being on, if not, just slightly shorter, but very close.  When she is supine on the Hana fracture table, we assessed under direct fluoroscopy and I  was pleased with the position.  We prepped and draped the right operative hip with DuraPrep and sterile drapes.  A time-out was called and she was identified as the correct patient, correct right hip.  We then made an incision just inferior and  posterior to the anterior superior iliac spine and carried this obliquely down the leg.  We dissected down tensor fascia lata muscle.  Tensor fascia was then divided longitudinally to proceed with direct anterior approach to the hip.  We identified and  cauterized circumflex vessels and identified the hip capsule, opened the capsule in an L-type format finding a large joint effusion and significant osteoarthritis around her right hip.  We placed Cobra retractors around the medial and lateral femoral  neck and used an oscillating saw to make our femoral neck cut proximal to the lesser trochanter and completed this with an osteotome.  We placed a corkscrew guide in the femoral head and removed the femoral head in its entirety.  We found it to be devoid  of cartilage.  We then placed a bent Hohmann over the medial acetabular rim and removed remnants of the acetabular labrum and other debris.  We then began reaming under direct visualization from a  size 43 reamer going all the way up to a size 49 with  all reamers under direct visualization, the last reamer under direct fluoroscopy, so I could obtain my depth of reaming by inclination and anteversion.  Once I was pleased with this, I placed the real DePuy Sector Gription acetabular component size 50  and a single screw due  to the sclerotic bone.  We then placed a 32+0 neutral polyethylene liner for that size acetabular component.  Attention was then turned to the femur.  With the leg externally rotated to 120 degrees, extended and adducted we were  able to place a Mueller retractor medially and Homans retractor above the greater trochanter.  We released the lateral joint capsule and used a box-cutting osteotome to enter femoral canal and a rongeur to lateralize.  We then began broaching from a size  8 broach using the Corail broaching system going up to a size 11.  With a size 11 in place, we tried a varus offset femoral neck based on her anatomy and a  32+1 hip ball.  We reduced this in the acetabulum and we were pleased with leg length, offset,  range of motion and stability.  We then dislocated the hip and removed the trial components.  We placed the real Corail femoral component, size 11, with varus offset and the real 32+1 metal hip ball.  Again reduced the acetabulum and we were pleased with  stability.  We then irrigated the soft tissue with normal saline solution using pulsatile lavage.  We closed the joint capsule with interrupted #1 Ethibond suture, followed by running 0 Vicryl and tensor fascia, 0 Vicryl in the deep tissue, 2-0 Vicryl  subcutaneous tissue and interrupted staples on the skin.  Xeroform and Aquacel dressing was applied.    She was taken off the Hana table, awakened, extubated, and taken to recovery room in stable condition.  All final counts were correct.  There were no complications noted.    Of note, Rexene Edison, PA-C, assisted the entire case.  His assistance was crucial for facilitating all aspects of this case.  AN/NUANCE  D:08/02/2018 T:08/02/2018 JOB:[id number]

## 2018-08-02 NOTE — Brief Op Note (Signed)
08/02/2018  8:46 AM  PATIENT:  Anne Wilson  72 y.o. female  PRE-OPERATIVE DIAGNOSIS:  Osteoarthritis Right Hip  POST-OPERATIVE DIAGNOSIS:  Osteoarthritis Right Hip  PROCEDURE:  Procedure(s): RIGHT TOTAL HIP ARTHROPLASTY ANTERIOR APPROACH (Right)  SURGEON:  Surgeon(s) and Role:    Kathryne Hitch, MD - Primary  PHYSICIAN ASSISTANT: Rexene Edison, PA-C  ANESTHESIA:   spinal and general  EBL:  500 mL   COUNTS:  YES  DICTATION: .Other Dictation: Dictation Number [id number]  PLAN OF CARE: Admit to inpatient   PATIENT DISPOSITION:  PACU - hemodynamically stable.   Delay start of Pharmacological VTE agent (>24hrs) due to surgical blood loss or risk of bleeding: no

## 2018-08-03 LAB — CBC
HEMATOCRIT: 34.4 % — AB (ref 36.0–46.0)
HEMOGLOBIN: 10.9 g/dL — AB (ref 12.0–15.0)
MCH: 29.8 pg (ref 26.0–34.0)
MCHC: 31.7 g/dL (ref 30.0–36.0)
MCV: 94 fL (ref 78.0–100.0)
Platelets: 267 10*3/uL (ref 150–400)
RBC: 3.66 MIL/uL — ABNORMAL LOW (ref 3.87–5.11)
RDW: 13.5 % (ref 11.5–15.5)
WBC: 13.4 10*3/uL — ABNORMAL HIGH (ref 4.0–10.5)

## 2018-08-03 LAB — BASIC METABOLIC PANEL
Anion gap: 5 (ref 5–15)
BUN: 14 mg/dL (ref 8–23)
CHLORIDE: 111 mmol/L (ref 98–111)
CO2: 25 mmol/L (ref 22–32)
CREATININE: 0.54 mg/dL (ref 0.44–1.00)
Calcium: 8.5 mg/dL — ABNORMAL LOW (ref 8.9–10.3)
GFR calc non Af Amer: >60 mL/min (ref >60)
Glucose, Bld: 124 mg/dL — ABNORMAL HIGH (ref 70–99)
POTASSIUM: 4.4 mmol/L (ref 3.5–5.1)
Sodium: 141 mmol/L (ref 135–145)

## 2018-08-03 MED ORDER — METHOCARBAMOL 500 MG PO TABS: 500.0000 mg | ORAL_TABLET | Freq: Four times a day (QID) | ORAL | 0 refills | Status: AC | PRN

## 2018-08-03 MED ORDER — ASPIRIN 81 MG PO CHEW: 81.0000 mg | CHEWABLE_TABLET | Freq: Two times a day (BID) | ORAL | 0 refills | Status: AC

## 2018-08-03 MED ORDER — OXYCODONE HCL 5 MG PO TABS: 5.0000 mg | ORAL_TABLET | ORAL | 0 refills | Status: AC | PRN

## 2018-08-03 NOTE — Discharge Instructions (Signed)

## 2018-08-03 NOTE — Plan of Care (Signed)
Pt alert and oriented, doing well this am.  Pain well controlled, up in chair this am. RN will monitor.

## 2018-08-03 NOTE — Progress Notes (Signed)
PT Cancellation Note  Patient Details Name: Anne Wilson MRN: [id number] DOB: [birth year]   Cancelled Treatment:    Reason Eval/Treat Not Completed: Pain limiting ability to participate   Rada Hay 08/03/2018, 5:31 PM Blanchard Kelch PT Acute Rehabilitation Services Pager [phone] Office [phone]

## 2018-08-03 NOTE — Evaluation (Signed)
Occupational Therapy Evaluation and Discharge Patient Details Name: Anne Wilson MRN: [id number] DOB: 1946-01-31 Today's Date: 08/03/2018    History of Present Illness s/p R DA THA   Clinical Impression   This 72 yo female admitted and underwent above presents to acute OT with all education completed, we will D/C from acute OT.    Follow Up Recommendations  No OT follow up;Supervision - Intermittent    Equipment Recommendations  None recommended by OT       Precautions / Restrictions Precautions Precautions: Fall Restrictions Weight Bearing Restrictions: No Other Position/Activity Restrictions: WBAT      Mobility Bed Mobility Overal bed mobility: Needs Assistance Bed Mobility: Supine to Sit;Sit to Supine     Supine to sit: Supervision(HOB flat and use of rail) Sit to supine: Supervision(HOB flat and use of hands to A RLE into bed)    Transfers Overall transfer level: Needs assistance Equipment used: Rolling walker (2 wheeled) Transfers: Sit to/from Stand Sit to Stand: Supervision                ADL either performed or assessed with clinical judgement   ADL Overall ADL's : Needs assistance/impaired Eating/Feeding: Independent;Sitting   Grooming: Supervision/safety;Set up;Standing   Upper Body Bathing: Set up;Sitting   Lower Body Bathing: Moderate assistance Lower Body Bathing Details (indicate cue type and reason): S sit<>stand Upper Body Dressing : Set up;Sitting   Lower Body Dressing: Moderate assistance Lower Body Dressing Details (indicate cue type and reason): S sit<>stand Toilet Transfer: Supervision/safety;Ambulation;Regular Toilet;Grab bars;RW   Toileting- Architect and Hygiene: Supervision/safety;Sit to/from stand   Tub/ Shower Transfer: Walk-in shower;Supervision/safety;Rolling walker;3 in 1 Tub/Shower Transfer Details (indicate cue type and reason): backing into shower stall, stepping in with good leg first and stepping out  with operated leg first   General ADL Comments: Husband will A pt prn with LB ADLs, educated pt and husband on most efficient squence of dressing     Vision Patient Visual Report: No change from baseline              Pertinent Vitals/Pain Pain Assessment: 0-10 Pain Score: 5  Pain Location: right thigh Pain Descriptors / Indicators: Aching;Sore Pain Intervention(s): Limited activity within patient's tolerance;Monitored during session;Repositioned;Ice applied     Hand Dominance Right   Extremity/Trunk Assessment Upper Extremity Assessment Upper Extremity Assessment: Overall WFL for tasks assessed           Communication Communication Communication: No difficulties   Cognition Arousal/Alertness: Awake/alert Behavior During Therapy: WFL for tasks assessed/performed Overall Cognitive Status: Within Functional Limits for tasks assessed                                                Home Living Family/patient expects to be discharged to:: Private residence Living Arrangements: Spouse/significant other Available Help at Discharge: Family;Available 24 hours/day Type of Home: House Home Access: Stairs to enter [employer] of Steps: 2 Entrance Stairs-Rails: None Home Layout: One level     Bathroom Shower/Tub: Walk-in shower;Door   [employer] Toilet: Standard(counter top beside it)     Home Equipment: Environmental consultant - 2 wheels;Bedside commode;Grab bars - tub/shower;Hand held shower head;Shower seat - built in          Prior Functioning/Environment Level of Independence: Independent  OT Problem List: Decreased strength;Decreased range of motion;Impaired balance (sitting and/or standing);Pain         OT Goals(Current goals can be found in the care plan section) Acute Rehab OT Goals Patient Stated Goal: home probably tomorrow  OT Frequency:                AM-PAC PT "6 Clicks" Daily Activity     Outcome Measure Help  from another person eating meals?: None Help from another person taking care of personal grooming?: A Little Help from another person toileting, which includes using toliet, bedpan, or urinal?: A Little Help from another person bathing (including washing, rinsing, drying)?: A Lot Help from another person to put on and taking off regular upper body clothing?: A Little Help from another person to put on and taking off regular lower body clothing?: A Lot 6 Click Score: 17   End of Session Equipment Utilized During Treatment: Rolling walker  Activity Tolerance: Patient tolerated treatment well Patient left: in bed;with call bell/phone within reach;with family/visitor present  OT Visit Diagnosis: Other abnormalities of gait and mobility (R26.89);Pain Pain - Right/Left: Right Pain - part of body: Hip                Time: 1351-1416 OT Time Calculation (min): 25 min Charges:  OT General Charges $OT Visit: 1 Visit OT Evaluation $OT Eval Moderate Complexity: 1 Mod OT Treatments $Self Care/Home Management : 8-22 mins  Ignacia Palma, OTR/L Acute [employer] Pager [phone] Office [phone]

## 2018-08-03 NOTE — Progress Notes (Signed)
Subjective: Patient stable.  Pain controlled.  Has been ambulating in hall.   Objective: Vital signs in last 24 hours: Temp:  [97.5 F (36.4 C)-98.5 F (36.9 C)] 98.5 F (36.9 C) (09/28 0934) Pulse Rate:  [75-96] 96 (09/28 0934) Resp:  [15-18] 15 (09/28 0934) BP: (103-139)/(61-77) 123/61 (09/28 0934) SpO2:  [92 %-98 %] 94 % (09/28 0934)  Intake/Output from previous day: 09/27 0701 - 09/28 0700 In: 2817.4 [P.O.:600; I.V.:1917.4; IV Piggyback:300] Out: 2950 [Urine:2450; Blood:500] Intake/Output this shift: Total I/O In: 752.9 [P.O.:120; I.V.:632.9] Out: 150 [Urine:150]  Exam:  Dorsiflexion/Plantar flexion intact  Labs: Recent Labs    08/03/18 0358  HGB 10.9*   Recent Labs    08/03/18 0358  WBC 13.4*  RBC 3.66*  HCT 34.4*  PLT 267   Recent Labs    08/03/18 0358  NA 141  K 4.4  CL 111  CO2 25  BUN 14  CREATININE 0.54  GLUCOSE 124*  CALCIUM 8.5*   No results for input(s): LABPT, INR in the last 72 hours.  Assessment/Plan: Plan at this time is patient is doing well.  Anticipate continuing with therapy and stairs tomorrow with possible discharge at that time versus Monday.  More than likely will be Sunday.   Marrianne Mood Dean 08/03/2018, 10:57 AM

## 2018-08-03 NOTE — Progress Notes (Signed)
Physical Therapy Treatment Patient Details Name: Anne Wilson MRN: [id number] DOB: [birth year] Today's Date: 08/03/2018    History of Present Illness s/p R DA THA    PT Comments    The patient is progressing well. Plans Dc tomorrow.    Follow Up Recommendations  Follow surgeon's recommendation for DC plan and follow-up therapies     Equipment Recommendations  None recommended by PT    Recommendations for Other Services       Precautions / Restrictions Precautions Precautions: Fall    Mobility  Bed Mobility               General bed mobility comments: in recliner  Transfers Overall transfer level: Needs assistance Equipment used: Rolling walker (2 wheeled) Transfers: Sit to/from Stand Sit to Stand: Supervision         General transfer comment: cues for hand placement and LE management  Ambulation/Gait Ambulation/Gait assistance: Supervision Gait Distance (Feet): 180 Feet Assistive device: Rolling walker (2 wheeled) Gait Pattern/deviations: Step-through pattern     General Gait Details: cues for sequence and RW position   Stairs             Wheelchair Mobility    Modified Rankin (Stroke Patients Only)       Balance                                            Cognition Arousal/Alertness: Awake/alert                                            Exercises Total Joint Exercises Ankle Circles/Pumps: AROM;Both;10 reps Heel Slides: AAROM;10 reps;Right;Supine Hip ABduction/ADduction: AROM;Right;10 reps;Supine Long Arc Quad: Seated;Right;AROM;10 reps    General Comments        Pertinent Vitals/Pain Pain Assessment: No/denies pain    Home Living                      Prior Function            PT Goals (current goals can now be found in the care plan section) Progress towards PT goals: Progressing toward goals    Frequency    7X/week      PT Plan Current plan remains  appropriate    Co-evaluation              AM-PAC PT "6 Clicks" Daily Activity  Outcome Measure  Difficulty turning over in bed (including adjusting bedclothes, sheets and blankets)?: A Little Difficulty moving from lying on back to sitting on the side of the bed? : A Little Difficulty sitting down on and standing up from a chair with arms (e.g., wheelchair, bedside commode, etc,.)?: A Little Help needed moving to and from a bed to chair (including a wheelchair)?: A Little Help needed walking in hospital room?: A Little Help needed climbing 3-5 steps with a railing? : Total 6 Click Score: 16    End of Session   Activity Tolerance: Patient tolerated treatment well Patient left: with call bell/phone within reach;in chair Nurse Communication: Mobility status PT Visit Diagnosis: Difficulty in walking, not elsewhere classified (R26.2)     Time: 7829-5621 PT Time Calculation (min) (ACUTE ONLY): 19 min  Charges:  $Gait Training: 8-22 mins  Blanchard Kelch PT Acute Rehabilitation Services Pager [phone] Office [phone]    Rada Hay 08/03/2018, 1:28 PM

## 2018-08-04 NOTE — Progress Notes (Signed)
Subjective: Pt stable - mild pain with afternoon PT   Objective: Vital signs in last 24 hours: Temp:  [97.8 F (36.6 C)-99.1 F (37.3 C)] 98.9 F (37.2 C) (09/29 0543) Pulse Rate:  [65-101] 91 (09/29 0543) Resp:  [15-18] 16 (09/29 0543) BP: (123-160)/(61-89) 137/68 (09/29 0543) SpO2:  [91 %-97 %] 91 % (09/29 0543)  Intake/Output from previous day: 09/28 0701 - 09/29 0700 In: 1632.9 [P.O.:1000; I.V.:632.9] Out: 150 [Urine:150] Intake/Output this shift: Total I/O In: 480 [P.O.:480] Out: -   Exam:  Dorsiflexion/Plantar flexion intact  Labs: Recent Labs    08/03/18 0358  HGB 10.9*   Recent Labs    08/03/18 0358  WBC 13.4*  RBC 3.66*  HCT 34.4*  PLT 267   Recent Labs    08/03/18 0358  NA 141  K 4.4  CL 111  CO2 25  BUN 14  CREATININE 0.54  GLUCOSE 124*  CALCIUM 8.5*   No results for input(s): LABPT, INR in the last 72 hours.  Assessment/Plan: Plan stairs with PT today then dc to home   G Dorene Grebe 08/04/2018, 9:28 AM

## 2018-08-04 NOTE — Discharge Summary (Signed)
Patient ID: Anne Wilson MRN: [id number] DOB/AGE: 05-24-1946 72 y.o.  Admit date: 08/02/2018 Discharge date: 08/04/2018  Admission Diagnoses:  Principal Problem:   Unilateral primary osteoarthritis, right hip Active Problems:   Status post total replacement of right hip   Discharge Diagnoses:  Same  Past Medical History:  Diagnosis Date  . Asthma   . Diverticulosis   . Mild sleep apnea    When retested 2019 at sleep center in [place] negative    Surgeries: Procedure(s): RIGHT TOTAL HIP ARTHROPLASTY ANTERIOR APPROACH on 08/02/2018   Consultants:   Discharged Condition: Improved  Hospital Course: Anne Wilson is an 72 y.o. female who was admitted 08/02/2018 for operative treatment ofUnilateral primary osteoarthritis, right hip. Patient has severe unremitting pain that affects sleep, daily activities, and work/hobbies. After pre-op clearance the patient was taken to the operating room on 08/02/2018 and underwent  Procedure(s): RIGHT TOTAL HIP ARTHROPLASTY ANTERIOR APPROACH.    Patient was given perioperative antibiotics:  Anti-infectives (From admission, onward)   Start     Dose/Rate Route Frequency Ordered Stop   08/02/18 1400  ceFAZolin (ANCEF) IVPB 1 g/50 mL premix     1 g 100 mL/hr over 30 Minutes Intravenous Every 6 hours 08/02/18 1125 08/02/18 2030   08/02/18 0600  ceFAZolin (ANCEF) IVPB 2g/100 mL premix     2 g 200 mL/hr over 30 Minutes Intravenous On call to O.R. 08/02/18 8295 08/02/18 0740       Patient was given sequential compression devices, early ambulation, and chemoprophylaxis to prevent DVT.  Patient benefited maximally from hospital stay and there were no complications.    Recent vital signs:  Patient Vitals for the past 24 hrs:  BP Temp Temp src Pulse Resp SpO2  08/04/18 0543 137/68 98.9 F (37.2 C) Oral 91 16 91 %  08/03/18 2145 (!) 154/72 99.1 F (37.3 C) Oral 87 18 94 %     Recent laboratory studies:  Recent Labs    08/03/18 0358   WBC 13.4*  HGB 10.9*  HCT 34.4*  PLT 267  NA 141  K 4.4  CL 111  CO2 25  BUN 14  CREATININE 0.54  GLUCOSE 124*  CALCIUM 8.5*     Discharge Medications:   Allergies as of 08/04/2018   No Known Allergies     Medication List    TAKE these medications   aspirin 81 MG chewable tablet Chew 1 tablet (81 mg total) by mouth 2 (two) times daily.   budesonide-formoterol 160-4.5 MCG/ACT inhaler Commonly known as:  SYMBICORT Inhale 2 puffs into the lungs 2 (two) times daily.   fluticasone 50 MCG/ACT nasal spray Commonly known as:  FLONASE Place 1 spray into both nostrils daily.   methocarbamol 500 MG tablet Commonly known as:  ROBAXIN Take 1 tablet (500 mg total) by mouth every 6 (six) hours as needed for muscle spasms.   naproxen sodium 220 MG tablet Commonly known as:  ALEVE Take 440 mg by mouth 2 (two) times daily.   oxyCODONE 5 MG immediate release tablet Commonly known as:  Oxy IR/ROXICODONE Take 1-2 tablets (5-10 mg total) by mouth every 4 (four) hours as needed for moderate pain (pain score 4-6).   Turmeric 500 MG Tabs Take 500 mg by mouth daily.   Vitamin D-3 5000 units Tabs Take 5,000 Units by mouth daily.       Diagnostic Studies: Dg Pelvis Portable  Result Date: 08/02/2018 CLINICAL DATA:  SP total right hip replacement. EXAM: PORTABLE PELVIS  1-2 VIEWS COMPARISON:  08/02/2018 FINDINGS: Status post RIGHT hip arthroplasty. The femoral head component is well seated in the acetabular component on the frontal view. Surgical clips overlie the hip. Soft tissue gas is present. IMPRESSION: Interval RIGHT hip arthroplasty.  No adverse features. Electronically Signed   By: Norva Pavlov M.D.   On: 08/02/2018 11:18   Dg C-arm 1-60 Min-no Report  Result Date: 08/02/2018 Fluoroscopy was utilized by the requesting physician.  No radiographic interpretation.   Dg Hip Operative Unilat W Or W/o Pelvis Right  Result Date: 08/02/2018 CLINICAL DATA:  Images for total  right hip replacement. Total fluoro 18 secs; EXAM: OPERATIVE RIGHT HIP (WITH PELVIS IF PERFORMED) 8 VIEWS TECHNIQUE: Fluoroscopic spot image(s) were submitted for interpretation post-operatively. COMPARISON:  06/24/2018 FINDINGS: Images are performed prior to and following RIGHT total hip arthroplasty. No interval fractures. Normal alignment on frontal views. IMPRESSION: RIGHT hip arthroplasty.  No adverse features. Electronically Signed   By: Norva Pavlov M.D.   On: 08/02/2018 09:41    Disposition:   Discharge Instructions    Call MD / Call 911   Complete by:  As directed    If you experience chest pain or shortness of breath, CALL 911 and be transported to the hospital emergency room.  If you develope a fever above 101 F, pus (white drainage) or increased drainage or redness at the wound, or calf pain, call your surgeon's office.   Constipation Prevention   Complete by:  As directed    Drink plenty of fluids.  Prune juice may be helpful.  You may use a stool softener, such as Colace (over the counter) 100 mg twice a day.  Use MiraLax (over the counter) for constipation as needed.   Diet - low sodium heart healthy   Complete by:  As directed    Increase activity slowly as tolerated   Complete by:  As directed       Follow-up Information    [facility] Follow up.   Specialty:  Home Health Services Why:  physical therapy Contact information: [address] [place] [address] [phone]        Kathryne Hitch, MD Follow up in 2 week(s).   Specialty:  Orthopedic Surgery Contact information: [address] [place] [address] [phone]            Signed: Kathryne Hitch 08/04/2018, 4:12 PM

## 2018-08-04 NOTE — Progress Notes (Signed)
Physical Therapy Treatment Patient Details Name: Anne Wilson MRN: [id number] DOB: Dec 09, 1945 Today's Date: 08/04/2018    History of Present Illness s/p R DA THA    PT Comments    Pt did well with stairs.  She and husband demonstrated good technique. She is safe to d/c from a PT standpoint.   Follow Up Recommendations  Follow surgeon's recommendation for DC plan and follow-up therapies     Equipment Recommendations  None recommended by PT    Recommendations for Other Services       Precautions / Restrictions Precautions Precautions: Fall Restrictions Weight Bearing Restrictions: No Other Position/Activity Restrictions: WBAT    Mobility  Bed Mobility               General bed mobility comments: in recliner  Transfers Overall transfer level: Modified independent Equipment used: Rolling walker (2 wheeled) Transfers: Sit to/from Stand Sit to Stand: Modified independent (Device/Increase time)         General transfer comment: MOD I with good technique  Ambulation/Gait Ambulation/Gait assistance: Supervision Gait Distance (Feet): 190 Feet Assistive device: Rolling walker (2 wheeled) Gait Pattern/deviations: Step-through pattern     General Gait Details: good sequencing.  Discussed how/when to transition to cane at home.   Stairs Stairs: Yes Stairs assistance: Supervision Stair Management: Step to pattern;Backwards;With walker Number of Stairs: 2 General stair comments: Pt performed 2x/ Once with PT, and once with husband. They demonstrated safe technique   Wheelchair Mobility    Modified Rankin (Stroke Patients Only)       Balance                                            Cognition Arousal/Alertness: Awake/alert Behavior During Therapy: WFL for tasks assessed/performed Overall Cognitive Status: Within Functional Limits for tasks assessed                                        Exercises       General Comments        Pertinent Vitals/Pain Pain Assessment: Faces Faces Pain Scale: Hurts a little bit Pain Location: right thigh Pain Descriptors / Indicators: Aching;Sore Pain Intervention(s): Limited activity within patient's tolerance;Monitored during session;Ice applied    Home Living                      Prior Function            PT Goals (current goals can now be found in the care plan section) Acute Rehab PT Goals Patient Stated Goal: home today PT Goal Formulation: With patient/family Time For Goal Achievement: 08/09/18 Potential to Achieve Goals: Good Progress towards PT goals: Progressing toward goals    Frequency    7X/week      PT Plan Current plan remains appropriate    Co-evaluation              AM-PAC PT "6 Clicks" Daily Activity  Outcome Measure  Difficulty turning over in bed (including adjusting bedclothes, sheets and blankets)?: A Little Difficulty moving from lying on back to sitting on the side of the bed? : A Little Difficulty sitting down on and standing up from a chair with arms (e.g., wheelchair, bedside commode, etc,.)?: None Help needed moving to and from  a bed to chair (including a wheelchair)?: None Help needed walking in hospital room?: A Little Help needed climbing 3-5 steps with a railing? : A Little 6 Click Score: 20    End of Session Equipment Utilized During Treatment: Gait belt Activity Tolerance: Patient tolerated treatment well Patient left: with call bell/phone within reach;in chair;with family/visitor present Nurse Communication: Mobility status PT Visit Diagnosis: Difficulty in walking, not elsewhere classified (R26.2)     Time: 1610-9604 PT Time Calculation (min) (ACUTE ONLY): 19 min  Charges:  $Gait Training: 8-22 mins                     Thao Vanover L. Katrinka Blazing, [place] Pager [phone] 08/04/2018    Enzo Montgomery 08/04/2018, 10:53 AM

## 2018-08-04 NOTE — Plan of Care (Signed)
Patient discharged home. Discharge instructions given to patient, spouse, and daughter. All verbalized understanding and all questions were answered. IV removed. Patient left the floor in stable condition

## 2018-08-05 ENCOUNTER — Encounter (HOSPITAL_COMMUNITY): Payer: Self-pay | Admitting: Orthopaedic Surgery

## 2018-08-05 ENCOUNTER — Telehealth (INDEPENDENT_AMBULATORY_CARE_PROVIDER_SITE_OTHER): Payer: Self-pay | Admitting: Orthopaedic Surgery

## 2018-08-05 NOTE — Telephone Encounter (Signed)
Asa Lente @[facility]  at [facility] request verbal orders 3 times a week for 2 weeks (PT)  callback # [phone]

## 2018-08-06 NOTE — Telephone Encounter (Signed)
Verbal order left on VM  

## 2018-08-15 ENCOUNTER — Ambulatory Visit (INDEPENDENT_AMBULATORY_CARE_PROVIDER_SITE_OTHER): Payer: Medicare Other | Admitting: Orthopaedic Surgery

## 2018-08-15 ENCOUNTER — Encounter (INDEPENDENT_AMBULATORY_CARE_PROVIDER_SITE_OTHER): Payer: Self-pay | Admitting: Orthopaedic Surgery

## 2018-08-15 DIAGNOSIS — Z96641 Presence of right artificial hip joint: Secondary | ICD-10-CM

## 2018-08-15 MED ORDER — HYDROCODONE-ACETAMINOPHEN 5-325 MG PO TABS: 1.0000 | ORAL_TABLET | Freq: Four times a day (QID) | ORAL | 0 refills | Status: AC | PRN

## 2018-08-15 NOTE — Progress Notes (Signed)
The patient is 2 weeks tomorrow status post right total hip arthroplasty.  She is doing well overall.  She is alternating between a walker and a cane.  She is on twice daily aspirin and denies any significant issues.  On exam remove the staples from her right hip incision looks good overall.  I placed Steri-Strips.  Her leg lengths are equal.  She does have some residual knee pain and she has some anterior shin pain but her calf is soft.  There is no swelling in the feet.  She will go down to once a day aspirin for the next week and then can stop this.  She can remove her TED hose intermittently.  Would like to see her back in 4 weeks to see how she is doing overall.  We will call in some hydrocodone for her pain.  All question concerns were answered and addressed.  When she returns in 4 weeks we do not need x-rays.

## 2018-08-16 ENCOUNTER — Telehealth (INDEPENDENT_AMBULATORY_CARE_PROVIDER_SITE_OTHER): Payer: Self-pay | Admitting: Orthopaedic Surgery

## 2018-08-16 NOTE — Telephone Encounter (Signed)
LMOM for patient, that handicap will be at front desk for her

## 2018-08-16 NOTE — Telephone Encounter (Signed)
ok 

## 2018-08-16 NOTE — Telephone Encounter (Signed)
That will be fine. 

## 2018-08-16 NOTE — Telephone Encounter (Signed)
Patient called needing a temporary handicap placard. The number to contact patient is [phone]

## 2018-09-12 ENCOUNTER — Ambulatory Visit (INDEPENDENT_AMBULATORY_CARE_PROVIDER_SITE_OTHER): Payer: Medicare Other | Admitting: Orthopaedic Surgery

## 2018-09-12 ENCOUNTER — Encounter (INDEPENDENT_AMBULATORY_CARE_PROVIDER_SITE_OTHER): Payer: Self-pay | Admitting: Orthopaedic Surgery

## 2018-09-12 DIAGNOSIS — Z96641 Presence of right artificial hip joint: Secondary | ICD-10-CM

## 2018-09-12 NOTE — Progress Notes (Signed)
The patient is now 6 weeks status post a right total hip arthroplasty.  She is gradually getting back to activities and doing well overall.  On exam her incision looks good.  Operative hip film range of motion on the right side with no issues at all.  Her leg lengths appear equal.  At this point should continue increase her activities as comfort allows.  All question concerns were answered and addressed.  We do not need to see her back for 6 months.  At that visit I like a standing low AP pelvis and lateral of her right operative hip.

## 2019-03-13 ENCOUNTER — Ambulatory Visit (INDEPENDENT_AMBULATORY_CARE_PROVIDER_SITE_OTHER): Payer: Medicare Other | Admitting: Orthopaedic Surgery

## 2019-03-15 IMAGING — RF DG HIP (WITH PELVIS) OPERATIVE*R*
1 series · 9 of 9 positions shown · non-contrast
Comparison: 06/24/2018

CLINICAL DATA: Images for total right hip replacement. Total fluoro
18 secs;

EXAM:
OPERATIVE RIGHT HIP (WITH PELVIS IF PERFORMED) 8 VIEWS
TECHNIQUE: Fluoroscopic spot image(s) were submitted for interpretation
post-operatively.

[Series 1: unknown protocol · 0.20mm/px · 9 of 9 slices shown]
[im 1/9]
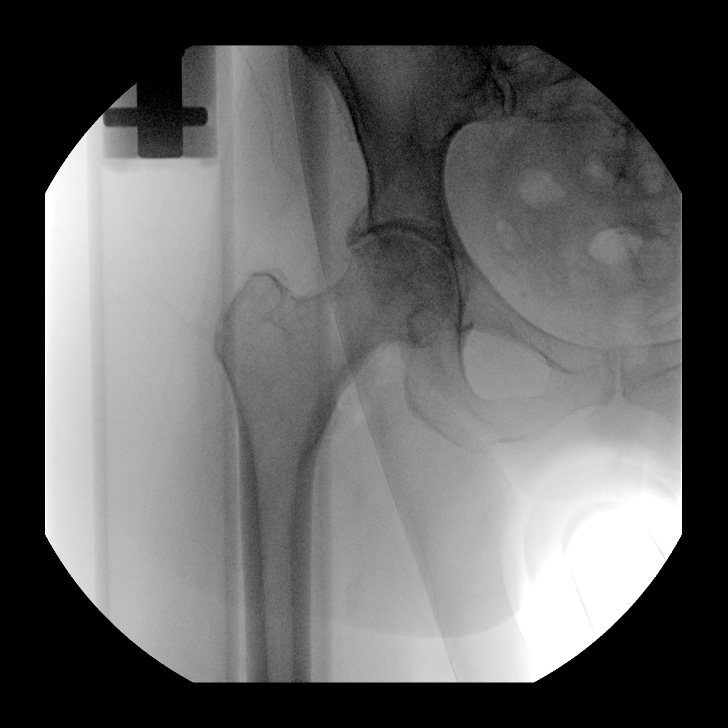
[im 2/9]
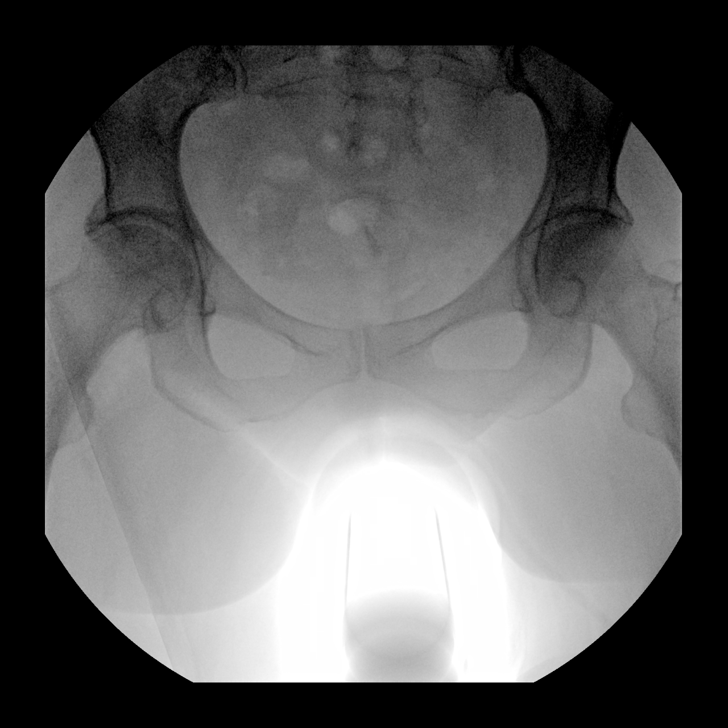
[im 3/9]
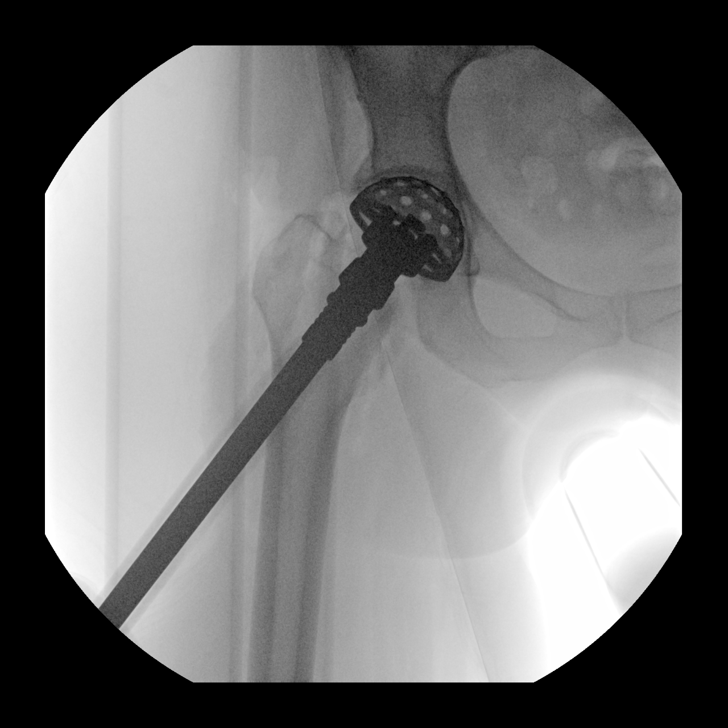
[im 4/9]
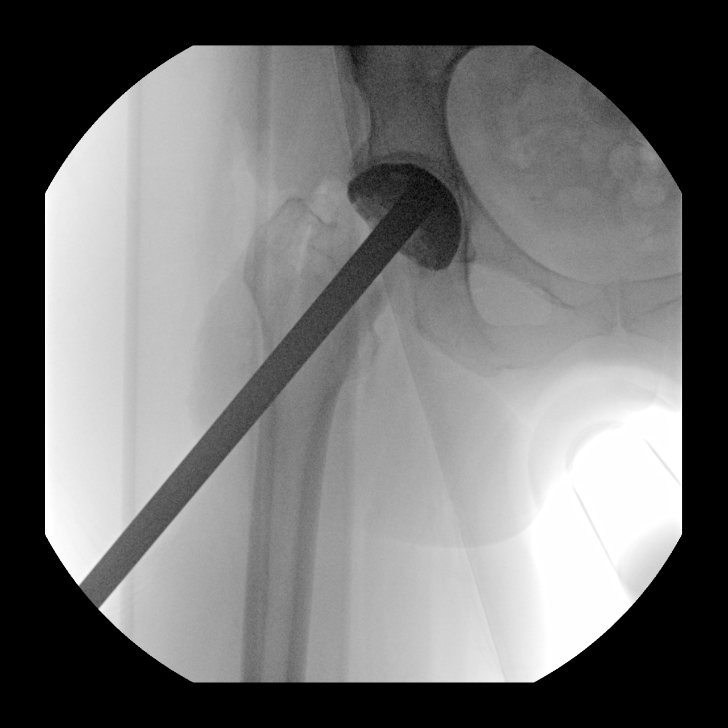
[im 5/9]
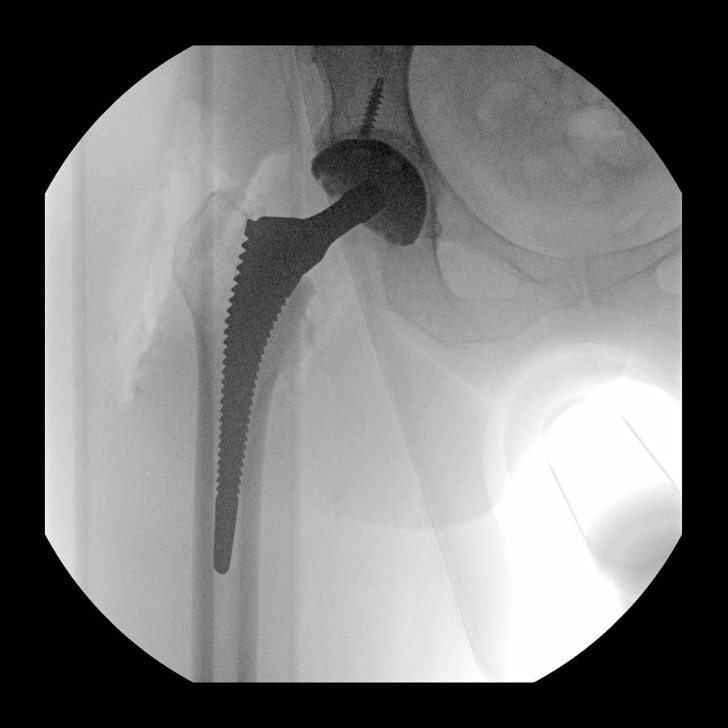
[im 6/9]
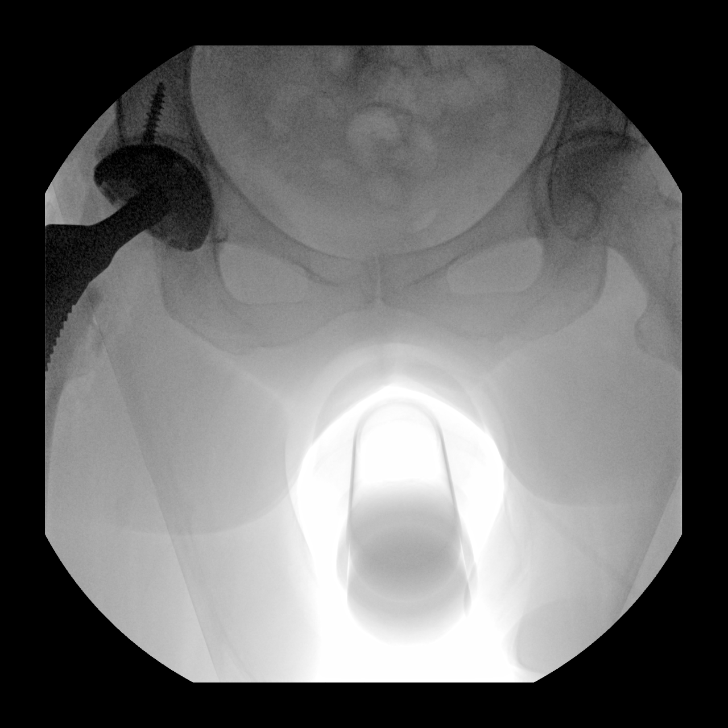
[im 7/9]
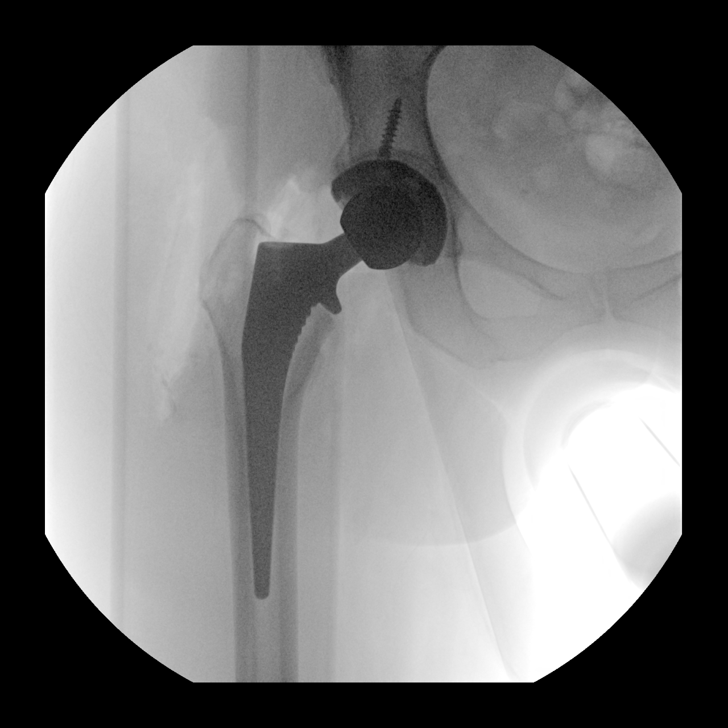
[im 8/9]
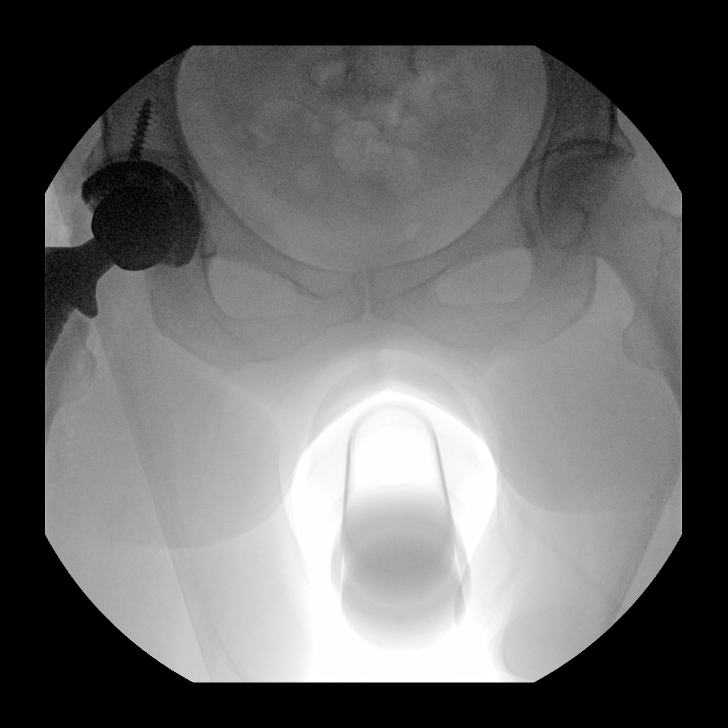
[im 9/9]
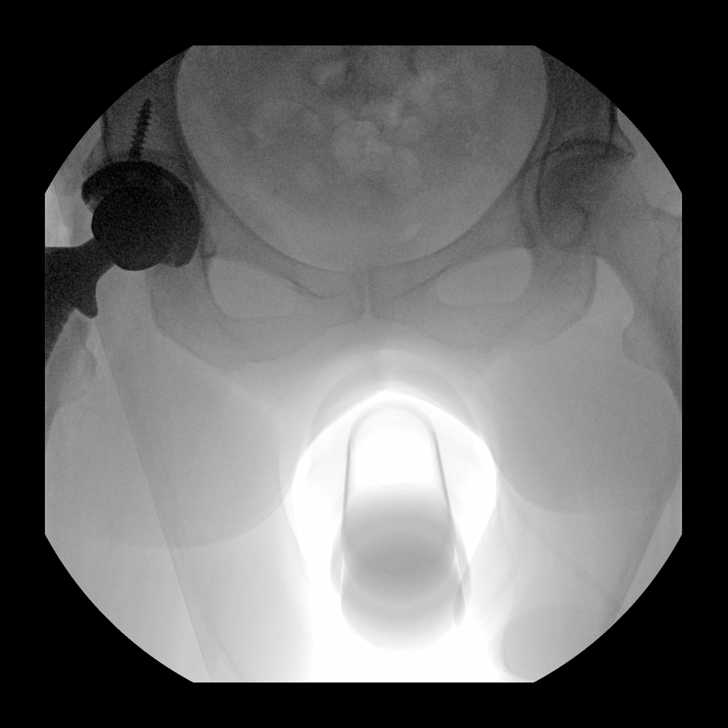

[9 of 9 positions shown; findings below may reference images not displayed]

FINDINGS: Images are performed prior to and following RIGHT total hip
arthroplasty. No interval fractures. Normal alignment on frontal
views.
IMPRESSION: RIGHT hip arthroplasty.  No adverse features.

## 2019-04-14 ENCOUNTER — Ambulatory Visit (INDEPENDENT_AMBULATORY_CARE_PROVIDER_SITE_OTHER): Payer: Medicare Other | Admitting: Orthopaedic Surgery

## 2019-04-14 ENCOUNTER — Ambulatory Visit: Payer: Self-pay

## 2019-04-14 ENCOUNTER — Encounter: Payer: Self-pay | Admitting: Orthopaedic Surgery

## 2019-04-14 ENCOUNTER — Other Ambulatory Visit: Payer: Self-pay

## 2019-04-14 VITALS — Ht 66.0 in | Wt 188.0 lb

## 2019-04-14 DIAGNOSIS — Z96641 Presence of right artificial hip joint: Secondary | ICD-10-CM | POA: Diagnosis not present

## 2019-04-14 NOTE — Progress Notes (Signed)
The patient is a very pleasant 73 year old female who is now 8 months status post a right total hip arthroplasty.  She has no issues with the hip at all and says she is doing great.  On exam she walks without a limp.  Her leg lengths are equal.  She has full internal and external rotation of the right hip actively and passively without any pain at all.  Her incisions well-healed.  An AP pelvis and lateral of her right operative hip shows a well-seated implant with no complicating features.  At this point follow-up can be as needed.  All question concerns were answered and addressed.  If there is any issues at all with that hip she knows to give [place] a call.

## 2019-10-06 ENCOUNTER — Other Ambulatory Visit: Payer: Self-pay

## 2019-10-06 DIAGNOSIS — Z20822 Contact with and (suspected) exposure to covid-19: Secondary | ICD-10-CM

## 2019-10-07 LAB — NOVEL CORONAVIRUS, NAA: SARS-CoV-2, NAA: DETECTED — AB

## 2019-10-08 ENCOUNTER — Telehealth: Payer: Self-pay | Admitting: Nurse Practitioner

## 2019-10-08 NOTE — Telephone Encounter (Signed)
Called to Discuss with patient about Covid symptoms and the use of bamlanivimab, a monoclonal antibody infusion for those with mild to moderate Covid symptoms and at a high risk of hospitalization.     Pt is qualified for this infusion at the [facility] infusion center due to co-morbid conditions and/or a member of an at-risk group.  Patient is over the age of 73 years old, is managed for obesity and asthma currently.    Patient states that she is not currently having any symptoms, but will call back if condition worsens.

## 2019-12-02 ENCOUNTER — Ambulatory Visit: Payer: Medicare Other

## 2019-12-11 ENCOUNTER — Ambulatory Visit: Payer: Medicare PPO | Attending: Internal Medicine

## 2019-12-11 DIAGNOSIS — Z23 Encounter for immunization: Secondary | ICD-10-CM

## 2019-12-11 NOTE — Progress Notes (Signed)
   Covid-19 Vaccination Clinic  Name:  KEVYN WENGERT    MRN: [id number] DOB: 1946/10/11  12/11/2019  Ms. Kinkade was observed post Covid-19 immunization for 15 minutes without incidence. She was provided with Vaccine Information Sheet and instruction to access the V-Safe system.   Ms. Lewan was instructed to call 911 with any severe reactions post vaccine: Marland Kitchen Difficulty breathing  . Swelling of your face and throat  . A fast heartbeat  . A bad rash all over your body  . Dizziness and weakness    Immunizations Administered    Name Date Dose VIS Date Route   Pfizer COVID-19 Vaccine 12/11/2019 11:01 AM 0.3 mL 10/17/2019 Intramuscular   Manufacturer: [employer], [employer]   Lot: [id number]   NDC: [address]-1

## 2019-12-19 ENCOUNTER — Ambulatory Visit: Payer: Medicare Other

## 2020-01-05 ENCOUNTER — Ambulatory Visit: Payer: Medicare PPO | Attending: Internal Medicine

## 2020-01-05 DIAGNOSIS — Z23 Encounter for immunization: Secondary | ICD-10-CM | POA: Insufficient documentation

## 2020-01-05 NOTE — Progress Notes (Signed)
   Covid-19 Vaccination Clinic  Name:  Anne Wilson    MRN: [id number] DOB: 09-05-1946  01/05/2020  [name] was observed post Covid-19 immunization for 15 minutes without incidence. She was provided with Vaccine Information Sheet and instruction to access the V-Safe system.   [name] was instructed to call 911 with any severe reactions post vaccine: Marland Kitchen Difficulty breathing  . Swelling of your face and throat  . A fast heartbeat  . A bad rash all over your body  . Dizziness and weakness    Immunizations Administered    Name Date Dose VIS Date Route   Pfizer COVID-19 Vaccine 01/05/2020  2:55 PM 0.3 mL 10/17/2019 Intramuscular   Manufacturer: [employer], [employer]   Lot: [id number]   NDC: [address]-9

## 2020-05-28 DIAGNOSIS — H6981 Other specified disorders of Eustachian tube, right ear: Secondary | ICD-10-CM | POA: Diagnosis not present

## 2020-05-28 DIAGNOSIS — J329 Chronic sinusitis, unspecified: Secondary | ICD-10-CM | POA: Diagnosis not present

## 2020-06-11 ENCOUNTER — Ambulatory Visit (INDEPENDENT_AMBULATORY_CARE_PROVIDER_SITE_OTHER): Payer: Medicare PPO | Admitting: Otolaryngology

## 2020-06-11 ENCOUNTER — Other Ambulatory Visit: Payer: Self-pay

## 2020-06-11 ENCOUNTER — Encounter (INDEPENDENT_AMBULATORY_CARE_PROVIDER_SITE_OTHER): Payer: Self-pay | Admitting: Otolaryngology

## 2020-06-11 VITALS — Temp 97.2°F

## 2020-06-11 DIAGNOSIS — H6981 Other specified disorders of Eustachian tube, right ear: Secondary | ICD-10-CM | POA: Diagnosis not present

## 2020-06-11 DIAGNOSIS — H6521 Chronic serous otitis media, right ear: Secondary | ICD-10-CM | POA: Diagnosis not present

## 2020-06-11 NOTE — Progress Notes (Signed)
HPI: Anne Wilson is a 74 y.o. female who returns today for evaluation of right ear complaints.  She has been using Flonase because of nasal drainage.  She is also treated with amoxicillin by her PCP for right ear infection.  She had a T-tube placed in the right ear in December 2018.  This apparently has now extruded..  Past Medical History:  Diagnosis Date  . Asthma   . Diverticulosis   . Mild sleep apnea    When retested 2019 at sleep center in [facility] negative   Past Surgical History:  Procedure Laterality Date  . JOINT REPLACEMENT     Right hip Dr. Allie Bossier 08-02-18  . RHINOPLASTY  1983  . TOTAL HIP ARTHROPLASTY Right 08/02/2018   Procedure: RIGHT TOTAL HIP ARTHROPLASTY ANTERIOR APPROACH;  Surgeon: Kathryne Hitch, MD;  Location: [facility];  Service: Orthopedics;  Laterality: Right;  . TUBAL LIGATION  1984   Social History   Socioeconomic History  . Marital status: Married    Spouse name: Not on file  . Number of children: a  . Years of education: Not on file  . Highest education level: Not on file  Occupational History  . Occupation: Agricultural engineer: [employer]  Tobacco Use  . Smoking status: Never Smoker  . Smokeless tobacco: Never Used  Vaping Use  . Vaping Use: Never used  Substance and Sexual Activity  . Alcohol use: Yes    Comment: social   . Drug use: No  . Sexual activity: Not Currently  Other Topics Concern  . Not on file  Social History Narrative  . Not on file   Social Determinants of Health   Financial Resource Strain:   . Difficulty of Paying Living Expenses:   Food Insecurity:   . Worried About Programme researcher, broadcasting/film/video in the Last Year:   . Barista in the Last Year:   Transportation Needs:   . Freight forwarder (Medical):   Marland Kitchen Lack of Transportation (Non-Medical):   Physical Activity:   . Days of Exercise per Week:   . Minutes of Exercise per Session:   Stress:   . Feeling of Stress :   Social Connections:    . Frequency of Communication with Friends and Family:   . Frequency of Social Gatherings with Friends and Family:   . Attends Religious Services:   . Active Member of Clubs or Organizations:   . Attends Banker Meetings:   Marland Kitchen Marital Status:    Family History  Problem Relation Age of Onset  . Coronary artery disease Father   . Heart failure Mother   . Emphysema Mother   . Lung cancer Brother    No Known Allergies Prior to Admission medications   Medication Sig Start Date End Date Taking? Authorizing Provider  aspirin 81 MG chewable tablet Chew 1 tablet (81 mg total) by mouth 2 (two) times daily. 08/03/18  Yes Kathryne Hitch, MD  budesonide-formoterol [facility]) 160-4.5 MCG/ACT inhaler Inhale 2 puffs into the lungs 2 (two) times daily.   Yes [provider]  Cholecalciferol (VITAMIN D-3) 5000 units TABS Take 5,000 Units by mouth daily.   Yes [provider]  fluticasone (FLONASE) 50 MCG/ACT nasal spray Place 1 spray into both nostrils daily.   Yes [provider]  HYDROcodone-acetaminophen (NORCO/VICODIN) 5-325 MG tablet Take 1-2 tablets by mouth every 6 (six) hours as needed for moderate pain. 08/15/18  Yes Kathryne Hitch,  MD  methocarbamol (ROBAXIN) 500 MG tablet Take 1 tablet (500 mg total) by mouth every 6 (six) hours as needed for muscle spasms. 08/03/18  Yes Kathryne Hitch, MD  naproxen sodium (ALEVE) 220 MG tablet Take 440 mg by mouth 2 (two) times daily.   Yes [provider]  oxyCODONE (OXY IR/ROXICODONE) 5 MG immediate release tablet Take 1-2 tablets (5-10 mg total) by mouth every 4 (four) hours as needed for moderate pain (pain score 4-6). 08/03/18  Yes Kathryne Hitch, MD  Turmeric 500 MG TABS Take 500 mg by mouth daily.   Yes [provider]     Positive ROS: Otherwise negative  All other systems have been reviewed and were otherwise negative with the exception of those mentioned in  the HPI and as above.  Physical Exam: Constitutional: Alert, well-appearing, no acute distress Ears: External ears without lesions or tenderness.  Left ear canal left TM are clear.  Right ear canal reveals an extruded T-tube within wax that was removed.  She has a retracted right TM with a serous otitis media.  I was able to insufflate some air behind the right TM in the office today. Nasal: External nose without lesions. . Clear nasal passages bilaterally with no signs of infection presently Oral: Lips and gums without lesions. Tongue and palate mucosa without lesions. Posterior oropharynx clear. Neck: No palpable adenopathy or masses Respiratory: Breathing comfortably  Skin: No facial/neck lesions or rash noted.  Procedures  Assessment: Chronic right eustachian tube dysfunction with recurrent right serous otitis media status post extrusion of T-tube.  Plan: I discussed with her concerning replacement of the T-tube and we will plan on scheduling this next week.  In the meantime she will try to pop the ear and use Flonase on a regular basis.   Narda Bonds, MD

## 2020-06-12 DIAGNOSIS — Z6831 Body mass index (BMI) 31.0-31.9, adult: Secondary | ICD-10-CM | POA: Diagnosis not present

## 2020-06-12 DIAGNOSIS — E669 Obesity, unspecified: Secondary | ICD-10-CM | POA: Diagnosis not present

## 2020-06-12 DIAGNOSIS — R32 Unspecified urinary incontinence: Secondary | ICD-10-CM | POA: Diagnosis not present

## 2020-06-12 DIAGNOSIS — Z825 Family history of asthma and other chronic lower respiratory diseases: Secondary | ICD-10-CM | POA: Diagnosis not present

## 2020-06-12 DIAGNOSIS — R030 Elevated blood-pressure reading, without diagnosis of hypertension: Secondary | ICD-10-CM | POA: Diagnosis not present

## 2020-06-12 DIAGNOSIS — Z8249 Family history of ischemic heart disease and other diseases of the circulatory system: Secondary | ICD-10-CM | POA: Diagnosis not present

## 2020-06-12 DIAGNOSIS — Z7951 Long term (current) use of inhaled steroids: Secondary | ICD-10-CM | POA: Diagnosis not present

## 2020-06-12 DIAGNOSIS — Z96649 Presence of unspecified artificial hip joint: Secondary | ICD-10-CM | POA: Diagnosis not present

## 2020-06-12 DIAGNOSIS — J45909 Unspecified asthma, uncomplicated: Secondary | ICD-10-CM | POA: Diagnosis not present

## 2020-06-14 ENCOUNTER — Other Ambulatory Visit: Payer: Self-pay

## 2020-06-14 ENCOUNTER — Ambulatory Visit (INDEPENDENT_AMBULATORY_CARE_PROVIDER_SITE_OTHER): Payer: Medicare PPO | Admitting: Otolaryngology

## 2020-06-14 VITALS — Temp 96.1°F

## 2020-06-14 DIAGNOSIS — H6981 Other specified disorders of Eustachian tube, right ear: Secondary | ICD-10-CM | POA: Diagnosis not present

## 2020-06-14 DIAGNOSIS — H6521 Chronic serous otitis media, right ear: Secondary | ICD-10-CM

## 2020-06-14 NOTE — Progress Notes (Signed)
HPI: Anne Wilson is a 74 y.o. female who returns today for evaluation of right ear problems.  She has had previous tubes in the past.  The last tube was a T-tube placed in 2018.  This had extruded on last visit and she has recurrent right serous otitis media with posterior TM retraction pocket.  She is seen here today to have a tube replaced in the right ear.  She has also had a chronic intermittent cough.  Past Medical History:  Diagnosis Date  . Asthma   . Diverticulosis   . Mild sleep apnea    When retested 2019 at sleep center in [facility] negative   Past Surgical History:  Procedure Laterality Date  . JOINT REPLACEMENT     Right hip Dr. Allie Bossier 08-02-18  . RHINOPLASTY  1983  . TOTAL HIP ARTHROPLASTY Right 08/02/2018   Procedure: RIGHT TOTAL HIP ARTHROPLASTY ANTERIOR APPROACH;  Surgeon: Kathryne Hitch, MD;  Location: [facility];  Service: Orthopedics;  Laterality: Right;  . TUBAL LIGATION  1984   Social History   Socioeconomic History  . Marital status: Married    Spouse name: Not on file  . Number of children: a  . Years of education: Not on file  . Highest education level: Not on file  Occupational History  . Occupation: Agricultural engineer: [employer]  Tobacco Use  . Smoking status: Never Smoker  . Smokeless tobacco: Never Used  Vaping Use  . Vaping Use: Never used  Substance and Sexual Activity  . Alcohol use: Yes    Comment: social   . Drug use: No  . Sexual activity: Not Currently  Other Topics Concern  . Not on file  Social History Narrative  . Not on file   Social Determinants of Health   Financial Resource Strain:   . Difficulty of Paying Living Expenses:   Food Insecurity:   . Worried About Programme researcher, broadcasting/film/video in the Last Year:   . Barista in the Last Year:   Transportation Needs:   . Freight forwarder (Medical):   Marland Kitchen Lack of Transportation (Non-Medical):   Physical Activity:   . Days of Exercise per Week:   .  Minutes of Exercise per Session:   Stress:   . Feeling of Stress :   Social Connections:   . Frequency of Communication with Friends and Family:   . Frequency of Social Gatherings with Friends and Family:   . Attends Religious Services:   . Active Member of Clubs or Organizations:   . Attends Banker Meetings:   Marland Kitchen Marital Status:    Family History  Problem Relation Age of Onset  . Coronary artery disease Father   . Heart failure Mother   . Emphysema Mother   . Lung cancer Brother    No Known Allergies Prior to Admission medications   Medication Sig Start Date End Date Taking? Authorizing Provider  aspirin 81 MG chewable tablet Chew 1 tablet (81 mg total) by mouth 2 (two) times daily. 08/03/18   Kathryne Hitch, MD  budesonide-formoterol [facility]) 160-4.5 MCG/ACT inhaler Inhale 2 puffs into the lungs 2 (two) times daily.    [provider]  Cholecalciferol (VITAMIN D-3) 5000 units TABS Take 5,000 Units by mouth daily.    [provider]  fluticasone (FLONASE) 50 MCG/ACT nasal spray Place 1 spray into both nostrils daily.    [provider]  HYDROcodone-acetaminophen (NORCO/VICODIN) 5-325 MG tablet  Take 1-2 tablets by mouth every 6 (six) hours as needed for moderate pain. 08/15/18   Kathryne Hitch, MD  methocarbamol (ROBAXIN) 500 MG tablet Take 1 tablet (500 mg total) by mouth every 6 (six) hours as needed for muscle spasms. 08/03/18   Kathryne Hitch, MD  naproxen sodium (ALEVE) 220 MG tablet Take 440 mg by mouth 2 (two) times daily.    [provider]  oxyCODONE (OXY IR/ROXICODONE) 5 MG immediate release tablet Take 1-2 tablets (5-10 mg total) by mouth every 4 (four) hours as needed for moderate pain (pain score 4-6). 08/03/18   Kathryne Hitch, MD  Turmeric 500 MG TABS Take 500 mg by mouth daily.    [provider]     Positive ROS: Otherwise negative  All other systems have been reviewed and  were otherwise negative with the exception of those mentioned in the HPI and as above.  Physical Exam: Constitutional: Alert, well-appearing, no acute distress Ears: External ears without lesions or tenderness. Ear canals are clear.  Right TM is retracted posteriorly with a serous otitis media.  A right M&T was performed in the office today using phenol as a topical anesthetic.  Anterior myringotomy was performed a serous effusion was aspirated and a T-tube was inserted without difficulty.  Ciprodex drops were applied. Nasal: External nose without lesions. Septum relatively midline with mild rhinitis.. Clear nasal passages bilaterally.  Nasal endoscopy was performed on the right side to evaluate the nasopharynx.  The nasopharynx was clear with no obstruction of the eustachian tube opening on the right side.  No postnasal drainage noted.. Oral: Lips and gums without lesions. Tongue and palate mucosa without lesions. Posterior oropharynx clear. Neck: No palpable adenopathy or masses Respiratory: Breathing comfortably  Skin: No facial/neck lesions or rash noted.  Myringotomy  Date/Time: 06/14/2020 9:31 AM Performed by: Drema Halon, MD Authorized by: Drema Halon, MD   Consent:    Consent obtained:  Verbal   Consent given by:  Patient   Risks discussed:  Bleeding and pain   Alternatives discussed:  No treatment Pre-procedure details:    Indications: serous otitis media and choronically retracted TM   Anesthesia:    Anesthesia method:  Topical application   Topical anesthetic:  Phenol Procedure Details:    Location:  Right TM   Hole made in:  Anterosuperior aspect of TM   Tube:  T-tube Findings:    Fluid:  Serous fluid Post-procedure details:    Patient tolerance of procedure:  Tolerated well, no immediate complications Comments:     Anterior myringotomy was performed and a modified T-tube was inserted without difficulty.  Patient has a posterior retraction pocket but  no evidence of cholesteatoma or active drainage.    Assessment: Chronic right serous otitis media with a posterior retraction pocket of the TM. Chronic intermittent cough.  Plan: Myringotomy and tube was performed today using a modified T-tube. Patient will follow up in 2 to 3 weeks for recheck. I gave her a prescription for Ciprodex drops to use if she develops any drainage from the ear. Also gave her some samples of Mucinex DM to try for the cough. She has seen pulmonary in the past and has been diagnosed with asthma and may need to go back to pulmonary if cough persist.   Narda Bonds, MD

## 2020-06-29 ENCOUNTER — Encounter (INDEPENDENT_AMBULATORY_CARE_PROVIDER_SITE_OTHER): Payer: Self-pay | Admitting: Otolaryngology

## 2020-06-29 ENCOUNTER — Other Ambulatory Visit: Payer: Self-pay

## 2020-06-29 ENCOUNTER — Ambulatory Visit (INDEPENDENT_AMBULATORY_CARE_PROVIDER_SITE_OTHER): Payer: Medicare PPO | Admitting: Otolaryngology

## 2020-06-29 VITALS — Temp 97.5°F

## 2020-06-29 DIAGNOSIS — Z4889 Encounter for other specified surgical aftercare: Secondary | ICD-10-CM

## 2020-06-29 NOTE — Progress Notes (Signed)
HPI: Anne Wilson is a 74 y.o. female who presents 14 days s/p placement of right T-tube.  She is hearing much better and having no drainage from the ear..   Past Medical History:  Diagnosis Date   Asthma    Diverticulosis    Mild sleep apnea    When retested 2019 at sleep center in [facility] negative   Past Surgical History:  Procedure Laterality Date   JOINT REPLACEMENT     Right hip Dr. Allie Bossier 08-02-18   RHINOPLASTY  1983   TOTAL HIP ARTHROPLASTY Right 08/02/2018   Procedure: RIGHT TOTAL HIP ARTHROPLASTY ANTERIOR APPROACH;  Surgeon: Kathryne Hitch, MD;  Location: [facility];  Service: Orthopedics;  Laterality: Right;   TUBAL LIGATION  1984   Social History   Socioeconomic History   Marital status: Married    Spouse name: Not on file   Number of children: a   Years of education: Not on file   Highest education level: Not on file  Occupational History   Occupation: Agricultural engineer: [employer]  Tobacco Use   Smoking status: Never Smoker   Smokeless tobacco: Never Used  Building services engineer Use: Never used  Substance and Sexual Activity   Alcohol use: Yes    Comment: social    Drug use: No   Sexual activity: Not Currently  Other Topics Concern   Not on file  Social History Narrative   Not on file   Social Determinants of Health   Financial Resource Strain:    Difficulty of Paying Living Expenses: Not on file  Food Insecurity:    Worried About Programme researcher, broadcasting/film/video in the Last Year: Not on file   [employer] of Food in the Last Year: Not on file  Transportation Needs:    Lack of Transportation (Medical): Not on file   Lack of Transportation (Non-Medical): Not on file  Physical Activity:    Days of Exercise per Week: Not on file   Minutes of Exercise per Session: Not on file  Stress:    Feeling of Stress : Not on file  Social Connections:    Frequency of Communication with Friends and Family: Not on file    Frequency of Social Gatherings with Friends and Family: Not on file   Attends Religious Services: Not on file   Active Member of Clubs or Organizations: Not on file   Attends Banker Meetings: Not on file   Marital Status: Not on file   Family History  Problem Relation Age of Onset   Coronary artery disease Father    Heart failure Mother    Emphysema Mother    Lung cancer Brother    No Known Allergies Prior to Admission medications   Medication Sig Start Date End Date Taking? Authorizing Provider  aspirin 81 MG chewable tablet Chew 1 tablet (81 mg total) by mouth 2 (two) times daily. 08/03/18  Yes Kathryne Hitch, MD  budesonide-formoterol [facility]) 160-4.5 MCG/ACT inhaler Inhale 2 puffs into the lungs 2 (two) times daily.   Yes [provider]  Cholecalciferol (VITAMIN D-3) 5000 units TABS Take 5,000 Units by mouth daily.   Yes [provider]  fluticasone (FLONASE) 50 MCG/ACT nasal spray Place 1 spray into both nostrils daily.   Yes [provider]  HYDROcodone-acetaminophen (NORCO/VICODIN) 5-325 MG tablet Take 1-2 tablets by mouth every 6 (six) hours as needed for moderate pain. 08/15/18  Yes Kathryne Hitch, MD  methocarbamol (ROBAXIN) 500 MG tablet Take 1 tablet (500 mg total) by mouth every 6 (six) hours as needed for muscle spasms. 08/03/18  Yes Kathryne Hitch, MD  naproxen sodium (ALEVE) 220 MG tablet Take 440 mg by mouth 2 (two) times daily.   Yes [provider]  oxyCODONE (OXY IR/ROXICODONE) 5 MG immediate release tablet Take 1-2 tablets (5-10 mg total) by mouth every 4 (four) hours as needed for moderate pain (pain score 4-6). 08/03/18  Yes Kathryne Hitch, MD  Turmeric 500 MG TABS Take 500 mg by mouth daily.   Yes [provider]     Physical Exam: Right T-tube is in good position with a small amount of dried drainage within the T-tube.  TM is clear with no middle ear effusion  noted   Assessment: S/p placement of right T-tube  Plan: She will follow-up as needed   Narda Bonds, MD

## 2020-07-29 ENCOUNTER — Telehealth: Payer: Self-pay | Admitting: Orthopaedic Surgery

## 2020-07-29 NOTE — Telephone Encounter (Signed)
Patient called requesting a letter from Dr. Magnus Ivan stating if she needs premedication before dentist appt. She had knee replacement surgery and would like this letter for her records to show at dentist appts. Patient need the letter to say weather she needs medication or don't need medication for dental visit and send to patient MyChart account. Patient phone number is [phone].

## 2020-07-30 NOTE — Telephone Encounter (Signed)
Note written and in her MyChart

## 2020-08-07 DIAGNOSIS — Z23 Encounter for immunization: Secondary | ICD-10-CM | POA: Diagnosis not present

## 2021-01-05 ENCOUNTER — Other Ambulatory Visit: Payer: Self-pay

## 2021-01-05 ENCOUNTER — Ambulatory Visit: Payer: Medicare PPO | Attending: Internal Medicine

## 2021-01-05 DIAGNOSIS — R42 Dizziness and giddiness: Secondary | ICD-10-CM | POA: Insufficient documentation

## 2021-01-05 DIAGNOSIS — H8113 Benign paroxysmal vertigo, bilateral: Secondary | ICD-10-CM | POA: Diagnosis not present

## 2021-01-05 NOTE — Therapy (Signed)
[facility] [address] [place], [place], [address] Phone: [phone]   Fax:  [phone]  Physical Therapy Evaluation  Patient Details  Name: Anne Wilson MRN: [id number] Date of Birth: 1946-06-03 Referring Provider (PT): Geoffry Paradise, MD   Encounter Date: 01/05/2021   PT End of Session - 01/05/21 1201    Visit Number 1    Number of Visits 6    Date for PT Re-Evaluation 03/06/21   POC for 5 weeks, Cert for 60 days   Authorization Type HTA    Progress Note Due on Visit 10    PT Start Time 1017    PT Stop Time 1100    PT Time Calculation (min) 43 min    Activity Tolerance Patient tolerated treatment well    Behavior During Therapy [facility] for tasks assessed/performed           Past Medical History:  Diagnosis Date  . Asthma   . Diverticulosis   . Mild sleep apnea    When retested 2019 at sleep center in [facility] negative    Past Surgical History:  Procedure Laterality Date  . JOINT REPLACEMENT     Right hip Dr. Allie Bossier 08-02-18  . RHINOPLASTY  1983  . TOTAL HIP ARTHROPLASTY Right 08/02/2018   Procedure: RIGHT TOTAL HIP ARTHROPLASTY ANTERIOR APPROACH;  Surgeon: Kathryne Hitch, MD;  Location: [facility];  Service: Orthopedics;  Laterality: Right;  . TUBAL LIGATION  1984    There were no vitals filed for this visit.    Subjective Assessment - 01/05/21 1021    Subjective Patient reports had an episode in 2014 with spinning sensation and nauseous, did the epley and it went away. Then had another episode in 2016. On January 3rd she fell backwards and hit her head, felt fine no ED visit due to fall. Patient reports that on January 14th that she began having the vertigo symptoms, followed by again on Febraury 4th. Patient reports that she has weird sensation when laying down (almost like a headache). No nauseous sensation recently. Patient reports she has tried the Epley. Denies changes in  vision/hearing. denies sudden changes in sensation/muscle weakness.    Pertinent History Prior THA; Asthma, Diverticulosis    Patient Stated Goals resolve the vertigo    Currently in Pain? No/denies              [facility] PT Assessment - 01/05/21 0001      Assessment   Medical Diagnosis Vertigo    Referring Provider (PT) Geoffry Paradise, MD    Onset Date/Surgical Date 12/29/20      Precautions   Precautions Fall      Balance Screen   Has the patient fallen in the past 6 months Yes    How many times? 1   fell on ice   Has the patient had a decrease in activity level because of a fear of falling?  No    Is the patient reluctant to leave their home because of a fear of falling?  No      Home Environment   Living Environment Private residence    Living Arrangements Spouse/significant other    Available Help at Discharge Family    Type of Home House    Additional Comments denies difficulty getting around home with dizziness.      Prior Function   Level of Independence Independent    Vocation Part time employment    Vocation Requirements Part Time at  [employer]      Cognition   Overall Cognitive Status Within Functional Limits for tasks assessed      Observation/Other Assessments   Focus on Therapeutic Outcomes (FOTO)  DFS: 52; DPS: 58.6      Sensation   Light Touch Appears Intact      Transfers   Transfers Sit to Stand;Stand to Sit    Sit to Stand 7: Independent    Stand to Sit 7: Independent      Ambulation/Gait   Ambulation/Gait Yes    Ambulation/Gait Assistance 7: Independent    Assistive device None    Gait Pattern Within Functional Limits    Ambulation Surface Level;Indoor               Vestibular Assessment - 01/05/21 0001      Symptom Behavior   Subjective history of current problem see subjective information    Type of Dizziness  Vertigo;Unsteady with head/body turns;"Funny feeling in head"    Frequency of Dizziness has had more recent occurence with  severe episode of vertigo.    Duration of Dizziness Lasts about a day with residual dizziness; seconds when lays back in bed    Symptom Nature Positional;Motion provoked    Aggravating Factors Lying supine    Relieving Factors Rest;Slow movements    Progression of Symptoms Better    History of similar episodes prior episode in 2014 and 2016      Oculomotor Exam   Oculomotor Alignment Normal    Ocular ROM WNL    Spontaneous Absent    Gaze-induced  Absent    Smooth Pursuits Intact    Saccades Intact      Oculomotor Exam-Fixation Suppressed    Left Head Impulse Normal    Right Head Impulse Positive; Corrective Saccade Noted      Vestibulo-Ocular Reflex   VOR 1 Head Only (x 1 viewing) No dizziness reported.      Visual Acuity   Static 8    Dynamic 8      Positional Testing   Dix-Hallpike Dix-Hallpike Right;Dix-Hallpike Left    Sidelying Test Sidelying Right;Sidelying Left      Dix-Hallpike Right   Dix-Hallpike Right Duration 60 secs    Dix-Hallpike Right Symptoms Upbeat, right rotatory nystagmus      Dix-Hallpike Left   Dix-Hallpike Left Duration 15 secs    Dix-Hallpike Left Symptoms Upbeat, left rotatory nystagmus      Sidelying Right   Sidelying Right Duration 60 seconds    Sidelying Right Symptoms Upbeat, right rotatory nystagmus      Sidelying Left   Sidelying Left Duration 0    Sidelying Left Symptoms No nystagmus              Objective measurements completed on examination: See above findings.        Vestibular Treatment/Exercise - 01/05/21 0001      Vestibular Treatment/Exercise   Vestibular Treatment Provided Canalith Repositioning    Canalith Repositioning Semont Procedure Right Posterior      Semont Procedure Right Posterior   Number of Reps  2    Overall Response  Improved Symptoms    Response Details  improved symptoms on 2nd rep; nystagmus still present                 PT Education - 01/05/21 1200    Education Details Educated  on POC/Evaluation Findings    Person(s) Educated Patient    Methods Explanation    Comprehension Verbalized understanding  PT Short Term Goals - 01/05/21 1202      PT SHORT TERM GOAL #1   Title Patient will verbalize independence with vestibular HEP if indicated (All STGs Due: 01/26/21)    Baseline no HEP establbished    Time 3    Period Weeks    Status New    Target Date 01/26/21             PT Long Term Goals - 01/05/21 1203      PT LONG TERM GOAL #1   Title Patient will report no vertigo/symptoms with bed mobility and demo negative positional testing to dmeo resolution of BPPV (ALL LTGs Due: 02/09/21)    Baseline B BPPV    Time 5    Period Weeks    Status New    Target Date 02/09/21      PT LONG TERM GOAL #2   Title Patient will improve DPS >/= 65    Baseline 58.6    Time 5    Period Weeks    Status New                  Plan - 01/05/21 1205    Clinical Impression Statement Patient is a 75 y.o. female that was referred to Neuro OPPT services for Vertigo. PMH is significant Prior THA; Asthma, Diverticulosis. Upon evaluation patient presents with the following impairments: dizziness, impaired balance, and positive positional testing. Patient had R Upbeating Nystagmus, approx 60 seconds indicative of R Posterior Canal Cupolithiasis. Patient also demonstrating L Upbeating Nystagmus of short duration indicative of L Posterior Canalithiasis. Patient scored 58.6 on Dizziness Positional Status. Patient will benefit from skilled PT services to address impairments, reduce vertiginous symptoms, and return to PLOF.    Personal Factors and Comorbidities Comorbidity 2    Comorbidities Prior THA; Asthma, Diverticulosis    Examination-Activity Limitations Bed Mobility;Stand    Examination-Participation Restrictions Occupation    Stability/Clinical Decision Making Stable/Uncomplicated    Clinical Decision Making Low    Rehab Potential Good    PT Frequency 1x / week     PT Duration --   5 weeks   PT Treatment/Interventions ADLs/Self Care Home Management;Canalith Repostioning;Moist Heat;Cryotherapy;Gait training;Stair training;Functional mobility training;Therapeutic activities;Therapeutic exercise;Balance training;Neuromuscular re-education;Patient/family education;Manual techniques;Passive range of motion;Vestibular    PT Next Visit Plan Reassess B BPPV; Treat as Indicated    Consulted and Agree with Plan of Care Patient           Patient will benefit from skilled therapeutic intervention in order to improve the following deficits and impairments:  Decreased activity tolerance,Decreased balance,Dizziness  Visit Diagnosis: BPPV (benign paroxysmal positional vertigo), bilateral  Dizziness and giddiness     Problem List Patient Active Problem List   Diagnosis Date Noted  . History of total right hip arthroplasty 04/14/2019  . Status post total replacement of right hip 08/02/2018  . Unilateral primary osteoarthritis, right hip 06/24/2018  . DYSPNEA 01/06/2011    [name], PT, [name] 01/05/2021, 12:15 PM  [facility] [address] [place], [place], [address] Phone: [phone]   Fax:  [phone]  Name: Anne Wilson MRN: [id number] Date of Birth: 01-17-1946

## 2021-01-10 ENCOUNTER — Ambulatory Visit: Payer: Medicare PPO

## 2021-01-10 ENCOUNTER — Other Ambulatory Visit: Payer: Self-pay

## 2021-01-10 DIAGNOSIS — H8113 Benign paroxysmal vertigo, bilateral: Secondary | ICD-10-CM | POA: Diagnosis not present

## 2021-01-10 DIAGNOSIS — R42 Dizziness and giddiness: Secondary | ICD-10-CM | POA: Diagnosis not present

## 2021-01-10 NOTE — Therapy (Signed)
[facility] [address] [place], [place], [address] Phone: [phone]   Fax:  [phone]  Physical Therapy Treatment  Patient Details  Name: Anne Wilson MRN: [id number] Date of Birth: 1945/12/24 Referring Provider (PT): Geoffry Paradise, MD   Encounter Date: 01/10/2021   PT End of Session - 01/10/21 1015    Visit Number 2    Number of Visits 6    Date for PT Re-Evaluation 03/06/21   POC for 5 weeks, Cert for 60 days   Authorization Type HTA    Progress Note Due on Visit 10    PT Start Time 1015    PT Stop Time 1057    PT Time Calculation (min) 42 min    Activity Tolerance Patient tolerated treatment well    Behavior During Therapy [facility] for tasks assessed/performed           Past Medical History:  Diagnosis Date  . Asthma   . Diverticulosis   . Mild sleep apnea    When retested 2019 at sleep center in [facility] negative    Past Surgical History:  Procedure Laterality Date  . JOINT REPLACEMENT     Right hip Dr. Allie Bossier 08-02-18  . RHINOPLASTY  1983  . TOTAL HIP ARTHROPLASTY Right 08/02/2018   Procedure: RIGHT TOTAL HIP ARTHROPLASTY ANTERIOR APPROACH;  Surgeon: Kathryne Hitch, MD;  Location: [facility];  Service: Orthopedics;  Laterality: Right;  . TUBAL LIGATION  1984    There were no vitals filed for this visit.   Subjective Assessment - 01/10/21 1018    Subjective Patient reports she continues to have spinning sensation, very minor but continues to notice them in the mornings. Patient has also had lightheadedness. No other new changes/complaints.    Pertinent History Prior THA; Asthma, Diverticulosis    Patient Stated Goals resolve the vertigo    Currently in Pain? No/denies            Vestibular Assessment - 01/10/21 0001      Positional Testing   Dix-Hallpike Dix-Hallpike Right;Dix-Hallpike Left      Dix-Hallpike Right   Dix-Hallpike Right Duration 45 seconds     Dix-Hallpike Right Symptoms Upbeat, right rotatory nystagmus      Dix-Hallpike Left   Dix-Hallpike Left Duration 0    Dix-Hallpike Left Symptoms No nystagmus             Vestibular Treatment/Exercise - 01/10/21 0001      Vestibular Treatment/Exercise   Vestibular Treatment Provided Canalith Repositioning    Canalith Repositioning Semont Procedure Right Posterior;Epley Manuever Right       EPLEY MANUEVER RIGHT   Number of Reps  3    Overall Response Improved Symptoms    Response Details  no nystagmus present on last rep      Semont Procedure Right Posterior   Number of Reps  2    Overall Response  Improved Symptoms    Response Details  completed vibration on second rep on R mastoid.                   PT Short Term Goals - 01/05/21 1202      PT SHORT TERM GOAL #1   Title Patient will verbalize independence with vestibular HEP if indicated (All STGs Due: 01/26/21)    Baseline no HEP establbished    Time 3    Period Weeks    Status New    Target Date 01/26/21  PT Long Term Goals - 01/05/21 1203      PT LONG TERM GOAL #1   Title Patient will report no vertigo/symptoms with bed mobility and demo negative positional testing to dmeo resolution of BPPV (ALL LTGs Due: 02/09/21)    Baseline B BPPV    Time 5    Period Weeks    Status New    Target Date 02/09/21      PT LONG TERM GOAL #2   Title Patient will improve DPS >/= 65    Baseline 58.6    Time 5    Period Weeks    Status New                 Plan - 01/10/21 1015    Clinical Impression Statement Today's skilled PT session included reassesment of B BPPV. Patient demonstrating negative L Gilberto Better. Patient did have R Upbeating Nystagmus, treated with Semont x 2 reps, and Epley x 3 reps with addition of vibration. Patient demonstrating no nystamgus/symptoms on last rep. Will continue to assess and progress toward all LTGs.    Personal Factors and Comorbidities Comorbidity 2     Comorbidities Prior THA; Asthma, Diverticulosis    Examination-Activity Limitations Bed Mobility;Stand    Examination-Participation Restrictions Occupation    Stability/Clinical Decision Making Stable/Uncomplicated    Rehab Potential Good    PT Frequency 1x / week    PT Duration --   5 weeks   PT Treatment/Interventions ADLs/Self Care Home Management;Canalith Repostioning;Moist Heat;Cryotherapy;Gait training;Stair training;Functional mobility training;Therapeutic activities;Therapeutic exercise;Balance training;Neuromuscular re-education;Patient/family education;Manual techniques;Passive range of motion;Vestibular    PT Next Visit Plan Reassess R BPPV and treat as indicated    Consulted and Agree with Plan of Care Patient           Patient will benefit from skilled therapeutic intervention in order to improve the following deficits and impairments:  Decreased activity tolerance,Decreased balance,Dizziness  Visit Diagnosis: BPPV (benign paroxysmal positional vertigo), bilateral  Dizziness and giddiness     Problem List Patient Active Problem List   Diagnosis Date Noted  . History of total right hip arthroplasty 04/14/2019  . Status post total replacement of right hip 08/02/2018  . Unilateral primary osteoarthritis, right hip 06/24/2018  . DYSPNEA 01/06/2011    [name] , PT, [name] 01/10/2021, 11:27 AM  [facility] [address] [place], [place], [address] Phone: [phone]   Fax:  [phone]  Name: Anne Wilson MRN: [id number] Date of Birth: September 30, 1946

## 2021-01-17 ENCOUNTER — Ambulatory Visit: Payer: Medicare PPO

## 2021-01-17 ENCOUNTER — Other Ambulatory Visit: Payer: Self-pay

## 2021-01-17 DIAGNOSIS — H8113 Benign paroxysmal vertigo, bilateral: Secondary | ICD-10-CM | POA: Diagnosis not present

## 2021-01-17 DIAGNOSIS — R42 Dizziness and giddiness: Secondary | ICD-10-CM | POA: Diagnosis not present

## 2021-01-17 NOTE — Therapy (Signed)
[facility] [address] [place], [place], [address] Phone: [phone]   Fax:  [phone]  Physical Therapy Treatment  Patient Details  Name: Anne Wilson MRN: [id number] Date of Birth: 01/20/1946 Referring Provider (PT): Geoffry Paradise, MD   Encounter Date: 01/17/2021   PT End of Session - 01/17/21 0848    Visit Number 3    Number of Visits 6    Date for PT Re-Evaluation 03/06/21   POC for 5 weeks, Cert for 60 days   Authorization Type HTA    Progress Note Due on Visit 10    PT Start Time 0847    PT Stop Time 0913    PT Time Calculation (min) 26 min    Activity Tolerance Patient tolerated treatment well    Behavior During Therapy [facility] for tasks assessed/performed           Past Medical History:  Diagnosis Date  . Asthma   . Diverticulosis   . Mild sleep apnea    When retested 2019 at sleep center in [facility] negative    Past Surgical History:  Procedure Laterality Date  . JOINT REPLACEMENT     Right hip Dr. Allie Bossier 08-02-18  . RHINOPLASTY  1983  . TOTAL HIP ARTHROPLASTY Right 08/02/2018   Procedure: RIGHT TOTAL HIP ARTHROPLASTY ANTERIOR APPROACH;  Surgeon: Kathryne Hitch, MD;  Location: [facility];  Service: Orthopedics;  Laterality: Right;  . TUBAL LIGATION  1984    There were no vitals filed for this visit.   Subjective Assessment - 01/17/21 0851    Subjective Patient reports lightheaded at times. But reports no spinning sensation recently. No other new changes/complaints.    Pertinent History Prior THA; Asthma, Diverticulosis    Patient Stated Goals resolve the vertigo    Currently in Pain? No/denies              [facility] PT Assessment - 01/17/21 0001      Observation/Other Assessments   Focus on Therapeutic Outcomes (FOTO)  DFS: 67; DPS: 69.8            Vestibular Assessment - 01/17/21 0001      Positional Testing   Dix-Hallpike Dix-Hallpike Right;Dix-Hallpike Left     Sidelying Test Sidelying Right;Sidelying Left    Horizontal Canal Testing Horizontal Canal Right;Horizontal Canal Left      Dix-Hallpike Right   Dix-Hallpike Right Duration 0    Dix-Hallpike Right Symptoms No nystagmus      Dix-Hallpike Left   Dix-Hallpike Left Duration 0    Dix-Hallpike Left Symptoms No nystagmus      Sidelying Right   Sidelying Right Duration 0    Sidelying Right Symptoms No nystagmus      Sidelying Left   Sidelying Left Duration 0    Sidelying Left Symptoms No nystagmus      Horizontal Canal Right   Horizontal Canal Right Duration 0    Horizontal Canal Right Symptoms Normal      Horizontal Canal Left   Horizontal Canal Left Duration 0    Horizontal Canal Left Symptoms Normal              PT Education - 01/17/21 0917    Education Details educated placing PT services on hold for two weeks due to potential for reoccurence of BPPV    Person(s) Educated Patient    Methods Explanation    Comprehension Verbalized understanding  PT Short Term Goals - 01/17/21 0920      PT SHORT TERM GOAL #1   Title Patient will verbalize independence with vestibular HEP if indicated (All STGs Due: 01/26/21)    Baseline no HEP indicated    Time 3    Period Weeks    Status Deferred    Target Date 01/26/21             PT Long Term Goals - 01/17/21 0914      PT LONG TERM GOAL #1   Title Patient will report no vertigo/symptoms with bed mobility and demo negative positional testing to dmeo resolution of BPPV (ALL LTGs Due: 02/09/21)    Baseline B BPPV    Time 5    Period Weeks    Status On-going      PT LONG TERM GOAL #2   Title Patient will improve DPS >/= 65    Baseline 58.6; 69.8    Time 5    Period Weeks    Status Achieved                 Plan - 01/17/21 0918    Clinical Impression Statement Today's skilled PT session included reassesment of BPPV. Patient demonstrating no nystagmus/symptoms with any positional testing today  indicating resolution of B BPPV. Patient scoring 69.7 on DPS during session. Due to high rate of reoccurence, visit scheduled for two weeks out for reassesment prior to d/c. Patient verbalizing agreement. Will continue per POC and progress toawrd all LTGs.    Personal Factors and Comorbidities Comorbidity 2    Comorbidities Prior THA; Asthma, Diverticulosis    Examination-Activity Limitations Bed Mobility;Stand    Examination-Participation Restrictions Occupation    Stability/Clinical Decision Making Stable/Uncomplicated    Rehab Potential Good    PT Frequency 1x / week    PT Duration --   5 weeks   PT Treatment/Interventions ADLs/Self Care Home Management;Canalith Repostioning;Moist Heat;Cryotherapy;Gait training;Stair training;Functional mobility training;Therapeutic activities;Therapeutic exercise;Balance training;Neuromuscular re-education;Patient/family education;Manual techniques;Passive range of motion;Vestibular    PT Next Visit Plan Reassess R BPPV and treat as indicated    Consulted and Agree with Plan of Care Patient           Patient will benefit from skilled therapeutic intervention in order to improve the following deficits and impairments:  Decreased activity tolerance,Decreased balance,Dizziness  Visit Diagnosis: BPPV (benign paroxysmal positional vertigo), bilateral  Dizziness and giddiness     Problem List Patient Active Problem List   Diagnosis Date Noted  . History of total right hip arthroplasty 04/14/2019  . Status post total replacement of right hip 08/02/2018  . Unilateral primary osteoarthritis, right hip 06/24/2018  . DYSPNEA 01/06/2011    Anne Wilson, PT, [name] 01/17/2021, 9:21 AM  [facility] [address] [place], [place], [address] Phone: [phone]   Fax:  [phone]  Name: Anne Wilson MRN: [id number] Date of Birth: Apr 06, 1946

## 2021-01-24 ENCOUNTER — Ambulatory Visit: Payer: Medicare PPO | Admitting: Physical Therapy

## 2021-01-31 ENCOUNTER — Ambulatory Visit: Payer: Medicare PPO

## 2021-02-07 ENCOUNTER — Ambulatory Visit: Payer: Medicare PPO

## 2021-04-18 DIAGNOSIS — N182 Chronic kidney disease, stage 2 (mild): Secondary | ICD-10-CM | POA: Diagnosis not present

## 2021-04-18 DIAGNOSIS — R7301 Impaired fasting glucose: Secondary | ICD-10-CM | POA: Diagnosis not present

## 2021-04-18 DIAGNOSIS — I129 Hypertensive chronic kidney disease with stage 1 through stage 4 chronic kidney disease, or unspecified chronic kidney disease: Secondary | ICD-10-CM | POA: Diagnosis not present

## 2021-04-18 DIAGNOSIS — Z0000 Encounter for general adult medical examination without abnormal findings: Secondary | ICD-10-CM | POA: Diagnosis not present

## 2021-04-20 DIAGNOSIS — R7301 Impaired fasting glucose: Secondary | ICD-10-CM | POA: Diagnosis not present

## 2021-04-20 DIAGNOSIS — I129 Hypertensive chronic kidney disease with stage 1 through stage 4 chronic kidney disease, or unspecified chronic kidney disease: Secondary | ICD-10-CM | POA: Diagnosis not present

## 2021-04-20 DIAGNOSIS — Z1339 Encounter for screening examination for other mental health and behavioral disorders: Secondary | ICD-10-CM | POA: Diagnosis not present

## 2021-04-20 DIAGNOSIS — Z1231 Encounter for screening mammogram for malignant neoplasm of breast: Secondary | ICD-10-CM | POA: Diagnosis not present

## 2021-04-20 DIAGNOSIS — M1611 Unilateral primary osteoarthritis, right hip: Secondary | ICD-10-CM | POA: Diagnosis not present

## 2021-04-20 DIAGNOSIS — E663 Overweight: Secondary | ICD-10-CM | POA: Diagnosis not present

## 2021-04-20 DIAGNOSIS — N182 Chronic kidney disease, stage 2 (mild): Secondary | ICD-10-CM | POA: Diagnosis not present

## 2021-04-20 DIAGNOSIS — R82998 Other abnormal findings in urine: Secondary | ICD-10-CM | POA: Diagnosis not present

## 2021-04-20 DIAGNOSIS — Z1331 Encounter for screening for depression: Secondary | ICD-10-CM | POA: Diagnosis not present

## 2021-04-20 DIAGNOSIS — Z0000 Encounter for general adult medical examination without abnormal findings: Secondary | ICD-10-CM | POA: Diagnosis not present

## 2021-04-25 DIAGNOSIS — H2512 Age-related nuclear cataract, left eye: Secondary | ICD-10-CM | POA: Diagnosis not present

## 2021-04-25 DIAGNOSIS — Z1212 Encounter for screening for malignant neoplasm of rectum: Secondary | ICD-10-CM | POA: Diagnosis not present

## 2021-04-25 DIAGNOSIS — H5212 Myopia, left eye: Secondary | ICD-10-CM | POA: Diagnosis not present

## 2021-04-25 DIAGNOSIS — H25012 Cortical age-related cataract, left eye: Secondary | ICD-10-CM | POA: Diagnosis not present

## 2021-04-25 DIAGNOSIS — Z961 Presence of intraocular lens: Secondary | ICD-10-CM | POA: Diagnosis not present

## 2021-07-07 DIAGNOSIS — H6501 Acute serous otitis media, right ear: Secondary | ICD-10-CM | POA: Diagnosis not present

## 2021-09-06 DIAGNOSIS — E663 Overweight: Secondary | ICD-10-CM | POA: Diagnosis not present

## 2021-09-06 DIAGNOSIS — M1990 Unspecified osteoarthritis, unspecified site: Secondary | ICD-10-CM | POA: Diagnosis not present

## 2021-09-06 DIAGNOSIS — K219 Gastro-esophageal reflux disease without esophagitis: Secondary | ICD-10-CM | POA: Diagnosis not present

## 2021-09-06 DIAGNOSIS — N189 Chronic kidney disease, unspecified: Secondary | ICD-10-CM | POA: Diagnosis not present

## 2021-09-06 DIAGNOSIS — R030 Elevated blood-pressure reading, without diagnosis of hypertension: Secondary | ICD-10-CM | POA: Diagnosis not present

## 2021-09-06 DIAGNOSIS — J45909 Unspecified asthma, uncomplicated: Secondary | ICD-10-CM | POA: Diagnosis not present

## 2021-09-06 DIAGNOSIS — R32 Unspecified urinary incontinence: Secondary | ICD-10-CM | POA: Diagnosis not present

## 2021-09-06 DIAGNOSIS — Z6829 Body mass index (BMI) 29.0-29.9, adult: Secondary | ICD-10-CM | POA: Diagnosis not present

## 2021-09-06 DIAGNOSIS — G4730 Sleep apnea, unspecified: Secondary | ICD-10-CM | POA: Diagnosis not present

## 2021-09-22 DIAGNOSIS — Z23 Encounter for immunization: Secondary | ICD-10-CM | POA: Diagnosis not present

## 2021-10-03 DIAGNOSIS — Z800 Family history of malignant neoplasm of digestive organs: Secondary | ICD-10-CM | POA: Diagnosis not present

## 2021-10-03 DIAGNOSIS — R195 Other fecal abnormalities: Secondary | ICD-10-CM | POA: Diagnosis not present

## 2021-10-03 DIAGNOSIS — Z86010 Personal history of colonic polyps: Secondary | ICD-10-CM | POA: Diagnosis not present

## 2021-11-25 DIAGNOSIS — H9201 Otalgia, right ear: Secondary | ICD-10-CM | POA: Diagnosis not present

## 2021-11-25 DIAGNOSIS — H6981 Other specified disorders of Eustachian tube, right ear: Secondary | ICD-10-CM | POA: Diagnosis not present

## 2021-11-25 DIAGNOSIS — H6690 Otitis media, unspecified, unspecified ear: Secondary | ICD-10-CM | POA: Diagnosis not present

## 2021-12-20 DIAGNOSIS — J310 Chronic rhinitis: Secondary | ICD-10-CM | POA: Diagnosis not present

## 2021-12-20 DIAGNOSIS — H6981 Other specified disorders of Eustachian tube, right ear: Secondary | ICD-10-CM | POA: Diagnosis not present

## 2021-12-20 DIAGNOSIS — H9011 Conductive hearing loss, unilateral, right ear, with unrestricted hearing on the contralateral side: Secondary | ICD-10-CM | POA: Diagnosis not present

## 2021-12-20 DIAGNOSIS — J343 Hypertrophy of nasal turbinates: Secondary | ICD-10-CM | POA: Diagnosis not present

## 2021-12-20 DIAGNOSIS — H6521 Chronic serous otitis media, right ear: Secondary | ICD-10-CM | POA: Diagnosis not present

## 2021-12-20 DIAGNOSIS — H6121 Impacted cerumen, right ear: Secondary | ICD-10-CM | POA: Diagnosis not present

## 2021-12-26 DIAGNOSIS — H6521 Chronic serous otitis media, right ear: Secondary | ICD-10-CM | POA: Diagnosis not present

## 2021-12-26 DIAGNOSIS — H6981 Other specified disorders of Eustachian tube, right ear: Secondary | ICD-10-CM | POA: Diagnosis not present

## 2021-12-26 DIAGNOSIS — H9011 Conductive hearing loss, unilateral, right ear, with unrestricted hearing on the contralateral side: Secondary | ICD-10-CM | POA: Diagnosis not present

## 2022-01-05 DIAGNOSIS — D122 Benign neoplasm of ascending colon: Secondary | ICD-10-CM | POA: Diagnosis not present

## 2022-01-05 DIAGNOSIS — K5730 Diverticulosis of large intestine without perforation or abscess without bleeding: Secondary | ICD-10-CM | POA: Diagnosis not present

## 2022-01-05 DIAGNOSIS — Z86010 Personal history of colonic polyps: Secondary | ICD-10-CM | POA: Diagnosis not present

## 2022-01-05 DIAGNOSIS — D120 Benign neoplasm of cecum: Secondary | ICD-10-CM | POA: Diagnosis not present

## 2022-01-05 DIAGNOSIS — K621 Rectal polyp: Secondary | ICD-10-CM | POA: Diagnosis not present

## 2022-01-05 DIAGNOSIS — D123 Benign neoplasm of transverse colon: Secondary | ICD-10-CM | POA: Diagnosis not present

## 2022-01-05 DIAGNOSIS — Z800 Family history of malignant neoplasm of digestive organs: Secondary | ICD-10-CM | POA: Diagnosis not present

## 2022-01-09 DIAGNOSIS — D122 Benign neoplasm of ascending colon: Secondary | ICD-10-CM | POA: Diagnosis not present

## 2022-01-09 DIAGNOSIS — D123 Benign neoplasm of transverse colon: Secondary | ICD-10-CM | POA: Diagnosis not present

## 2022-01-09 DIAGNOSIS — D120 Benign neoplasm of cecum: Secondary | ICD-10-CM | POA: Diagnosis not present

## 2022-01-09 DIAGNOSIS — K621 Rectal polyp: Secondary | ICD-10-CM | POA: Diagnosis not present

## 2022-01-25 DIAGNOSIS — H903 Sensorineural hearing loss, bilateral: Secondary | ICD-10-CM | POA: Diagnosis not present

## 2022-01-25 DIAGNOSIS — H6981 Other specified disorders of Eustachian tube, right ear: Secondary | ICD-10-CM | POA: Diagnosis not present

## 2022-01-25 DIAGNOSIS — H7201 Central perforation of tympanic membrane, right ear: Secondary | ICD-10-CM | POA: Diagnosis not present

## 2022-02-07 ENCOUNTER — Ambulatory Visit: Payer: Medicare PPO | Attending: Internal Medicine

## 2022-02-07 DIAGNOSIS — R42 Dizziness and giddiness: Secondary | ICD-10-CM | POA: Diagnosis not present

## 2022-02-07 NOTE — Therapy (Signed)
?OUTPATIENT PHYSICAL THERAPY VESTIBULAR EVALUATION ? ? ? ? ?Patient Name: Anne Wilson ?MRN: [id number] ?DOB:[birth year], 76 y.o., female ?Today's Date: 02/07/2022 ? ?PCP: Geoffry Paradise, MD ?REFERRING PROVIDER: Geoffry Paradise, MD ? ? PT End of Session - 02/07/22 0840   ? ? Visit Number 1   ? Number of Visits 5   ? Date for PT Re-Evaluation 03/14/22   ? Authorization Type [employer]   ? PT Start Time 0840   ? PT Stop Time 0925   ? PT Time Calculation (min) 45 min   ? Activity Tolerance Patient tolerated treatment well   ? Behavior During Therapy [facility] for tasks assessed/performed   ? ?  ?  ? ?  ? ? ?Past Medical History:  ?Diagnosis Date  ? Asthma   ? Diverticulosis   ? Mild sleep apnea   ? When retested 2019 at sleep center in [place] negative  ? ?Past Surgical History:  ?Procedure Laterality Date  ? JOINT REPLACEMENT    ? Right hip Dr. Allie Bossier 08-02-18  ? RHINOPLASTY  1983  ? TOTAL HIP ARTHROPLASTY Right 08/02/2018  ? Procedure: RIGHT TOTAL HIP ARTHROPLASTY ANTERIOR APPROACH;  Surgeon: Kathryne Hitch, MD;  Location: [facility];  Service: Orthopedics;  Laterality: Right;  ? TUBAL LIGATION  1984  ? ?Patient Active Problem List  ? Diagnosis Date Noted  ? History of total right hip arthroplasty 04/14/2019  ? Status post total replacement of right hip 08/02/2018  ? Unilateral primary osteoarthritis, right hip 06/24/2018  ? DYSPNEA 01/06/2011  ? ? ?ONSET DATE: 02/03/22 (Referral Date) ? ?REFERRING DIAG: R42 (ICD-10-CM) - Vertigo  ? ?THERAPY DIAG:  ?Dizziness and giddiness ? ?SUBJECTIVE:  ? ?SUBJECTIVE STATEMENT: ?Patient reports that after last POC was feeling very good. Reports had an attack of Vertigo in December out shopping. Then had another attack while driving and was very scary experience. Reports since these episodes has had a lot of dizziness and pounding in the Right Ear. Patient reports that saw the ENT. Reports had the right ear cleaned out due to amount of wax, and had a new tube placed in  the right ear. The pounding has resolved, but the dizziness has stayed about the same. Reports feels dizziness mainly in the mornings. Reports that she describes it as a lightheadedness that passes by quickly. Denies spinning sensation. 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 Reports balance feels pretty well. Reports mild lightheadedness sensation sitting her today.  ?Pt accompanied by: self ? ?PERTINENT HISTORY: Asthma, Diverticulosis, Sleep Apnea ? ? ?PAIN:  ?Are you having pain? No ? ?PRECAUTIONS: None ? ?WEIGHT BEARING RESTRICTIONS No ? ?FALLS: Has patient fallen in last 6 months? No ? ?LIVING ENVIRONMENT: ?Lives with: lives with their family ?Lives in: House/apartment ?Has following equipment at home: None ? ?PLOF: Independent with household mobility with device and Independent with community mobility with device ? ?PATIENT GOALS Try to self manage symptoms ? ?OBJECTIVE:  ? ?COGNITION: ?Overall cognitive status: Within functional limits for tasks assessed ?  ?SENSATION: ?WFL ? ?POSTURE: No Significant postural limitations ? ? ?STRENGTH: WFL ? ? ?TRANSFERS: ?Assistive device utilized: None  ?Sit to stand: Complete Independence ?Stand to sit: Complete Independence ? ? ?GAIT: ?Gait pattern: WFL ?Distance walked: 115 ?Assistive device utilized: None ?Level of assistance: Complete Independence ? ? ? ?VESTIBULAR ASSESSMENT ?  ? SYMPTOM BEHAVIOR: ?  Subjective history: See Subjective ?  Non-Vestibular symptoms: changes in hearing ?  Type of dizziness:  Lightheadedness/Faint ?  Frequency: daily ?  Duration: seconds ?  Aggravating factors: Worse in the morning ?  Relieving factors:  just giving time to past ?  Progression of symptoms: better ? ? OCULOMOTOR EXAM: ?  Ocular Alignment: normal ?  Ocular ROM: No Limitations ?  Spontaneous Nystagmus: absent ?  Gaze-Induced Nystagmus: absent ?  Smooth Pursuits: intact ?  Saccades: intact ? ?  ? ? VESTIBULAR - OCULAR REFLEX:  ?   ?  VOR Cancellation:  Normal ?  Head-Impulse Test: HIT Right: negative ?HIT Left: negative ?  ? ?MOTION SENSITIVITY: ? ?  Motion Sensitivity Quotient ? ?Intensity: 0 = none, 1 = Lightheaded, 2 = Mild, 3 = Moderate, 4 = Severe, 5 = Vomiting ? Intensity  ?1. Sitting to supine 0  ?2. Supine to L side   ?3. Supine to R side   ?4. Supine to sitting 0  ?5. L Hallpike-Dix   ?6. Up from L    ?7. R Hallpike-Dix   ?8. Up from R    ?9. Sitting, head  ?tipped to L knee   ?10. Head up from L  ?knee   ?11. Sitting, head  ?tipped to R knee   ?12. Head up from R  ?knee   ?13. Sitting head turns x5   ?14.Sitting head nods x5   ?15. In stance, 180?  ?turn to L    ?16. In stance, 180?  ?turn to R   ?  ?OTHOSTATICS: Supine: 121/76 with HR 71, Seated:  118/70, HR: 76 (Patient mild lightheadedness with transition to supine > seated. Standing (1 Min Mark): 116/73 with HR 79, Standing (3 min mark): 118/79 with HR 83. No lightheadedness noted with transition from seated > standing. ? ? ?PATIENT EDUCATION: ?Education details: BP Changes + Lightheadedness; Ways to Compensate for BP Changes. Will plan to follow up in 4 weeks ?Person educated: Patient ?Education method: Explanation ?Education comprehension: verbalized understanding ? ? ?GOALS:  ?SHORT TERM GOALS = LONG TERM GOALS ?LONG TERM GOALS: Target date: 03/14/2022 ? ?Patient will verbalize understanding of Orthostatic and self management techniques to  ?Baseline: Dependent ?Goal status: INITIAL ? ?2.  Further LTG to be set if needed in case of reoccurrence of Vertigo ?Baseline:  ?Goal status: INITIAL ? ?ASSESSMENT: ? ?CLINICAL IMPRESSION: ?Patient is a 76 y.o. female referred to Neuro OPPT services for Dizziness. Patient's PMH significant for the following: Asthma, Diverticulosis, Sleep Apnea . Upon evaluation, patient presents with the following impairments: lightheadedness, dizziness and mild drop in BP demonstrating orthostatics with position change. Patient did have normal oculomotor exam and no vertigo  during session. Patient will benefit from skilled PT services to address impairments, learn ways to self manage and compensate for lightheadedness to allow for self management of symptoms.  ? ? ? ?OBJECTIVE IMPAIRMENTS dizziness.  ? ?ACTIVITY LIMITATIONS  None .  ? ?PERSONAL FACTORS Time since onset of injury/illness/exacerbation and 1-2 comorbidities: Asthma, Diverticulosis, Sleep Apnea  are also affecting patient's functional outcome.  ? ? ?REHAB POTENTIAL: Good ? ?CLINICAL DECISION MAKING: Stable/uncomplicated ? ?EVALUATION COMPLEXITY: Low ? ? ?PLAN: ?PT FREQUENCY: 1x/week ? ?PT DURATION: 4 weeks ? ?PLANNED INTERVENTIONS: Therapeutic exercises, Therapeutic activity, Neuromuscular re-education, Balance training, Gait training, Patient/Family education, Joint manipulation, Joint mobilization, Vestibular training, and Canalith repositioning ? ?PLAN FOR NEXT SESSION: How have we been feeling, check in regarding symptoms. Do we need additional assesment and further visits if have reoccurence of Vertigo.  ? ? ?[name], PT, [name] ?02/07/2022, 11:47  AM ? ?

## 2022-04-26 DIAGNOSIS — Z1231 Encounter for screening mammogram for malignant neoplasm of breast: Secondary | ICD-10-CM | POA: Diagnosis not present

## 2022-04-27 DIAGNOSIS — H25012 Cortical age-related cataract, left eye: Secondary | ICD-10-CM | POA: Diagnosis not present

## 2022-04-27 DIAGNOSIS — H26491 Other secondary cataract, right eye: Secondary | ICD-10-CM | POA: Diagnosis not present

## 2022-04-27 DIAGNOSIS — H2512 Age-related nuclear cataract, left eye: Secondary | ICD-10-CM | POA: Diagnosis not present

## 2022-04-27 DIAGNOSIS — H524 Presbyopia: Secondary | ICD-10-CM | POA: Diagnosis not present

## 2022-05-15 DIAGNOSIS — I129 Hypertensive chronic kidney disease with stage 1 through stage 4 chronic kidney disease, or unspecified chronic kidney disease: Secondary | ICD-10-CM | POA: Diagnosis not present

## 2022-05-15 DIAGNOSIS — R7301 Impaired fasting glucose: Secondary | ICD-10-CM | POA: Diagnosis not present

## 2022-05-15 DIAGNOSIS — R7989 Other specified abnormal findings of blood chemistry: Secondary | ICD-10-CM | POA: Diagnosis not present

## 2022-05-17 DIAGNOSIS — Z1331 Encounter for screening for depression: Secondary | ICD-10-CM | POA: Diagnosis not present

## 2022-05-17 DIAGNOSIS — N182 Chronic kidney disease, stage 2 (mild): Secondary | ICD-10-CM | POA: Diagnosis not present

## 2022-05-17 DIAGNOSIS — I129 Hypertensive chronic kidney disease with stage 1 through stage 4 chronic kidney disease, or unspecified chronic kidney disease: Secondary | ICD-10-CM | POA: Diagnosis not present

## 2022-05-17 DIAGNOSIS — H832X9 Labyrinthine dysfunction, unspecified ear: Secondary | ICD-10-CM | POA: Diagnosis not present

## 2022-05-17 DIAGNOSIS — Z0000 Encounter for general adult medical examination without abnormal findings: Secondary | ICD-10-CM | POA: Diagnosis not present

## 2022-05-17 DIAGNOSIS — R7301 Impaired fasting glucose: Secondary | ICD-10-CM | POA: Diagnosis not present

## 2022-05-17 DIAGNOSIS — R7989 Other specified abnormal findings of blood chemistry: Secondary | ICD-10-CM | POA: Diagnosis not present

## 2022-05-17 DIAGNOSIS — E663 Overweight: Secondary | ICD-10-CM | POA: Diagnosis not present

## 2022-05-17 DIAGNOSIS — R82998 Other abnormal findings in urine: Secondary | ICD-10-CM | POA: Diagnosis not present

## 2022-05-17 DIAGNOSIS — M1611 Unilateral primary osteoarthritis, right hip: Secondary | ICD-10-CM | POA: Diagnosis not present

## 2022-05-18 DIAGNOSIS — Z0000 Encounter for general adult medical examination without abnormal findings: Secondary | ICD-10-CM | POA: Diagnosis not present

## 2022-07-31 DIAGNOSIS — H9011 Conductive hearing loss, unilateral, right ear, with unrestricted hearing on the contralateral side: Secondary | ICD-10-CM | POA: Diagnosis not present

## 2022-07-31 DIAGNOSIS — H6521 Chronic serous otitis media, right ear: Secondary | ICD-10-CM | POA: Diagnosis not present

## 2022-07-31 DIAGNOSIS — H6981 Other specified disorders of Eustachian tube, right ear: Secondary | ICD-10-CM | POA: Diagnosis not present

## 2022-07-31 DIAGNOSIS — H6123 Impacted cerumen, bilateral: Secondary | ICD-10-CM | POA: Diagnosis not present

## 2022-10-25 DIAGNOSIS — H9011 Conductive hearing loss, unilateral, right ear, with unrestricted hearing on the contralateral side: Secondary | ICD-10-CM | POA: Diagnosis not present

## 2022-10-25 DIAGNOSIS — H6521 Chronic serous otitis media, right ear: Secondary | ICD-10-CM | POA: Diagnosis not present

## 2022-10-25 DIAGNOSIS — H6981 Other specified disorders of Eustachian tube, right ear: Secondary | ICD-10-CM | POA: Diagnosis not present

## 2022-11-27 DIAGNOSIS — Z23 Encounter for immunization: Secondary | ICD-10-CM | POA: Diagnosis not present

## 2022-12-13 DIAGNOSIS — H6981 Other specified disorders of Eustachian tube, right ear: Secondary | ICD-10-CM | POA: Diagnosis not present

## 2022-12-13 DIAGNOSIS — H6531 Chronic mucoid otitis media, right ear: Secondary | ICD-10-CM | POA: Diagnosis not present

## 2022-12-13 DIAGNOSIS — H903 Sensorineural hearing loss, bilateral: Secondary | ICD-10-CM | POA: Diagnosis not present

## 2022-12-13 DIAGNOSIS — H73891 Other specified disorders of tympanic membrane, right ear: Secondary | ICD-10-CM | POA: Diagnosis not present

## 2022-12-13 DIAGNOSIS — H90A31 Mixed conductive and sensorineural hearing loss, unilateral, right ear with restricted hearing on the contralateral side: Secondary | ICD-10-CM | POA: Diagnosis not present

## 2022-12-13 DIAGNOSIS — Z8669 Personal history of other diseases of the nervous system and sense organs: Secondary | ICD-10-CM | POA: Diagnosis not present

## 2022-12-13 DIAGNOSIS — H6991 Unspecified Eustachian tube disorder, right ear: Secondary | ICD-10-CM | POA: Diagnosis not present

## 2022-12-13 DIAGNOSIS — H90A22 Sensorineural hearing loss, unilateral, left ear, with restricted hearing on the contralateral side: Secondary | ICD-10-CM | POA: Diagnosis not present

## 2023-01-31 DIAGNOSIS — H903 Sensorineural hearing loss, bilateral: Secondary | ICD-10-CM | POA: Diagnosis not present

## 2023-01-31 DIAGNOSIS — H90A31 Mixed conductive and sensorineural hearing loss, unilateral, right ear with restricted hearing on the contralateral side: Secondary | ICD-10-CM | POA: Diagnosis not present

## 2023-01-31 DIAGNOSIS — H90A22 Sensorineural hearing loss, unilateral, left ear, with restricted hearing on the contralateral side: Secondary | ICD-10-CM | POA: Diagnosis not present

## 2023-01-31 DIAGNOSIS — H6991 Unspecified Eustachian tube disorder, right ear: Secondary | ICD-10-CM | POA: Diagnosis not present

## 2023-01-31 DIAGNOSIS — H7091 Unspecified mastoiditis, right ear: Secondary | ICD-10-CM | POA: Diagnosis not present

## 2023-01-31 DIAGNOSIS — H6531 Chronic mucoid otitis media, right ear: Secondary | ICD-10-CM | POA: Diagnosis not present

## 2023-02-19 DIAGNOSIS — H6531 Chronic mucoid otitis media, right ear: Secondary | ICD-10-CM | POA: Diagnosis not present

## 2023-02-21 DIAGNOSIS — H90A31 Mixed conductive and sensorineural hearing loss, unilateral, right ear with restricted hearing on the contralateral side: Secondary | ICD-10-CM | POA: Diagnosis not present

## 2023-02-21 DIAGNOSIS — H6991 Unspecified Eustachian tube disorder, right ear: Secondary | ICD-10-CM | POA: Diagnosis not present

## 2023-02-21 DIAGNOSIS — H903 Sensorineural hearing loss, bilateral: Secondary | ICD-10-CM | POA: Diagnosis not present

## 2023-02-21 DIAGNOSIS — H6531 Chronic mucoid otitis media, right ear: Secondary | ICD-10-CM | POA: Diagnosis not present

## 2023-04-10 DIAGNOSIS — L821 Other seborrheic keratosis: Secondary | ICD-10-CM | POA: Diagnosis not present

## 2023-05-02 DIAGNOSIS — Z1231 Encounter for screening mammogram for malignant neoplasm of breast: Secondary | ICD-10-CM | POA: Diagnosis not present

## 2023-05-03 DIAGNOSIS — H2512 Age-related nuclear cataract, left eye: Secondary | ICD-10-CM | POA: Diagnosis not present

## 2023-05-03 DIAGNOSIS — H25012 Cortical age-related cataract, left eye: Secondary | ICD-10-CM | POA: Diagnosis not present

## 2023-05-03 DIAGNOSIS — H524 Presbyopia: Secondary | ICD-10-CM | POA: Diagnosis not present

## 2023-05-03 DIAGNOSIS — H26491 Other secondary cataract, right eye: Secondary | ICD-10-CM | POA: Diagnosis not present

## 2023-05-03 DIAGNOSIS — Z961 Presence of intraocular lens: Secondary | ICD-10-CM | POA: Diagnosis not present

## 2023-06-13 DIAGNOSIS — Z0000 Encounter for general adult medical examination without abnormal findings: Secondary | ICD-10-CM | POA: Diagnosis not present

## 2023-06-13 DIAGNOSIS — Z1212 Encounter for screening for malignant neoplasm of rectum: Secondary | ICD-10-CM | POA: Diagnosis not present

## 2023-06-13 DIAGNOSIS — R7301 Impaired fasting glucose: Secondary | ICD-10-CM | POA: Diagnosis not present

## 2023-06-13 DIAGNOSIS — I129 Hypertensive chronic kidney disease with stage 1 through stage 4 chronic kidney disease, or unspecified chronic kidney disease: Secondary | ICD-10-CM | POA: Diagnosis not present

## 2023-06-13 DIAGNOSIS — R7989 Other specified abnormal findings of blood chemistry: Secondary | ICD-10-CM | POA: Diagnosis not present

## 2023-06-13 DIAGNOSIS — Z1339 Encounter for screening examination for other mental health and behavioral disorders: Secondary | ICD-10-CM | POA: Diagnosis not present

## 2023-06-13 DIAGNOSIS — E663 Overweight: Secondary | ICD-10-CM | POA: Diagnosis not present

## 2023-06-13 DIAGNOSIS — R82998 Other abnormal findings in urine: Secondary | ICD-10-CM | POA: Diagnosis not present

## 2023-06-13 DIAGNOSIS — Z79899 Other long term (current) drug therapy: Secondary | ICD-10-CM | POA: Diagnosis not present

## 2023-06-13 DIAGNOSIS — Z1331 Encounter for screening for depression: Secondary | ICD-10-CM | POA: Diagnosis not present

## 2023-06-13 DIAGNOSIS — G4730 Sleep apnea, unspecified: Secondary | ICD-10-CM | POA: Diagnosis not present

## 2023-06-13 DIAGNOSIS — N182 Chronic kidney disease, stage 2 (mild): Secondary | ICD-10-CM | POA: Diagnosis not present

## 2023-07-05 DIAGNOSIS — H26491 Other secondary cataract, right eye: Secondary | ICD-10-CM | POA: Diagnosis not present

## 2023-08-15 DIAGNOSIS — H903 Sensorineural hearing loss, bilateral: Secondary | ICD-10-CM | POA: Diagnosis not present

## 2023-08-15 DIAGNOSIS — H6993 Unspecified Eustachian tube disorder, bilateral: Secondary | ICD-10-CM | POA: Diagnosis not present

## 2023-08-15 DIAGNOSIS — H906 Mixed conductive and sensorineural hearing loss, bilateral: Secondary | ICD-10-CM | POA: Diagnosis not present

## 2023-08-15 DIAGNOSIS — H6991 Unspecified Eustachian tube disorder, right ear: Secondary | ICD-10-CM | POA: Diagnosis not present

## 2023-08-15 DIAGNOSIS — H6531 Chronic mucoid otitis media, right ear: Secondary | ICD-10-CM | POA: Diagnosis not present

## 2023-08-15 DIAGNOSIS — Z01118 Encounter for examination of ears and hearing with other abnormal findings: Secondary | ICD-10-CM | POA: Diagnosis not present

## 2023-08-21 DIAGNOSIS — H2512 Age-related nuclear cataract, left eye: Secondary | ICD-10-CM | POA: Diagnosis not present

## 2023-08-21 DIAGNOSIS — H25012 Cortical age-related cataract, left eye: Secondary | ICD-10-CM | POA: Diagnosis not present

## 2023-08-21 DIAGNOSIS — H25812 Combined forms of age-related cataract, left eye: Secondary | ICD-10-CM | POA: Diagnosis not present

## 2023-08-21 DIAGNOSIS — Z961 Presence of intraocular lens: Secondary | ICD-10-CM | POA: Diagnosis not present

## 2023-10-08 DIAGNOSIS — L820 Inflamed seborrheic keratosis: Secondary | ICD-10-CM | POA: Diagnosis not present

## 2023-12-07 DIAGNOSIS — H6531 Chronic mucoid otitis media, right ear: Secondary | ICD-10-CM | POA: Diagnosis not present

## 2023-12-07 DIAGNOSIS — H938X9 Other specified disorders of ear, unspecified ear: Secondary | ICD-10-CM | POA: Diagnosis not present

## 2023-12-07 DIAGNOSIS — H6993 Unspecified Eustachian tube disorder, bilateral: Secondary | ICD-10-CM | POA: Diagnosis not present

## 2023-12-07 DIAGNOSIS — H903 Sensorineural hearing loss, bilateral: Secondary | ICD-10-CM | POA: Diagnosis not present

## 2023-12-07 DIAGNOSIS — H6983 Other specified disorders of Eustachian tube, bilateral: Secondary | ICD-10-CM | POA: Diagnosis not present

## 2024-04-14 DIAGNOSIS — H6993 Unspecified Eustachian tube disorder, bilateral: Secondary | ICD-10-CM | POA: Diagnosis not present

## 2024-04-14 DIAGNOSIS — R490 Dysphonia: Secondary | ICD-10-CM | POA: Diagnosis not present

## 2024-04-14 DIAGNOSIS — R499 Unspecified voice and resonance disorder: Secondary | ICD-10-CM | POA: Diagnosis not present

## 2024-04-14 DIAGNOSIS — H90A31 Mixed conductive and sensorineural hearing loss, unilateral, right ear with restricted hearing on the contralateral side: Secondary | ICD-10-CM | POA: Diagnosis not present

## 2024-04-14 DIAGNOSIS — Z48810 Encounter for surgical aftercare following surgery on the sense organs: Secondary | ICD-10-CM | POA: Diagnosis not present

## 2024-04-14 DIAGNOSIS — Z9622 Myringotomy tube(s) status: Secondary | ICD-10-CM | POA: Diagnosis not present

## 2024-04-14 DIAGNOSIS — H90A22 Sensorineural hearing loss, unilateral, left ear, with restricted hearing on the contralateral side: Secondary | ICD-10-CM | POA: Diagnosis not present

## 2024-05-07 DIAGNOSIS — Z1231 Encounter for screening mammogram for malignant neoplasm of breast: Secondary | ICD-10-CM | POA: Diagnosis not present

## 2024-06-03 DIAGNOSIS — U071 COVID-19: Secondary | ICD-10-CM | POA: Diagnosis not present

## 2024-06-03 DIAGNOSIS — Z6830 Body mass index (BMI) 30.0-30.9, adult: Secondary | ICD-10-CM | POA: Diagnosis not present

## 2024-06-16 DIAGNOSIS — R7301 Impaired fasting glucose: Secondary | ICD-10-CM | POA: Diagnosis not present

## 2024-06-16 DIAGNOSIS — I129 Hypertensive chronic kidney disease with stage 1 through stage 4 chronic kidney disease, or unspecified chronic kidney disease: Secondary | ICD-10-CM | POA: Diagnosis not present

## 2024-06-16 DIAGNOSIS — N182 Chronic kidney disease, stage 2 (mild): Secondary | ICD-10-CM | POA: Diagnosis not present

## 2024-06-19 DIAGNOSIS — J383 Other diseases of vocal cords: Secondary | ICD-10-CM | POA: Diagnosis not present

## 2024-06-19 DIAGNOSIS — R490 Dysphonia: Secondary | ICD-10-CM | POA: Diagnosis not present

## 2024-06-19 DIAGNOSIS — J385 Laryngeal spasm: Secondary | ICD-10-CM | POA: Diagnosis not present

## 2024-06-19 DIAGNOSIS — R499 Unspecified voice and resonance disorder: Secondary | ICD-10-CM | POA: Diagnosis not present

## 2024-06-30 DIAGNOSIS — R82998 Other abnormal findings in urine: Secondary | ICD-10-CM | POA: Diagnosis not present

## 2024-06-30 DIAGNOSIS — J45909 Unspecified asthma, uncomplicated: Secondary | ICD-10-CM | POA: Diagnosis not present

## 2024-06-30 DIAGNOSIS — R7301 Impaired fasting glucose: Secondary | ICD-10-CM | POA: Diagnosis not present

## 2024-06-30 DIAGNOSIS — Z1331 Encounter for screening for depression: Secondary | ICD-10-CM | POA: Diagnosis not present

## 2024-06-30 DIAGNOSIS — Z0000 Encounter for general adult medical examination without abnormal findings: Secondary | ICD-10-CM | POA: Diagnosis not present

## 2024-06-30 DIAGNOSIS — Z1339 Encounter for screening examination for other mental health and behavioral disorders: Secondary | ICD-10-CM | POA: Diagnosis not present

## 2024-06-30 DIAGNOSIS — I129 Hypertensive chronic kidney disease with stage 1 through stage 4 chronic kidney disease, or unspecified chronic kidney disease: Secondary | ICD-10-CM | POA: Diagnosis not present

## 2024-06-30 DIAGNOSIS — N182 Chronic kidney disease, stage 2 (mild): Secondary | ICD-10-CM | POA: Diagnosis not present

## 2024-07-11 DIAGNOSIS — R499 Unspecified voice and resonance disorder: Secondary | ICD-10-CM | POA: Diagnosis not present

## 2024-07-11 DIAGNOSIS — R490 Dysphonia: Secondary | ICD-10-CM | POA: Diagnosis not present

## 2024-07-11 DIAGNOSIS — J383 Other diseases of vocal cords: Secondary | ICD-10-CM | POA: Diagnosis not present

## 2024-07-11 DIAGNOSIS — J385 Laryngeal spasm: Secondary | ICD-10-CM | POA: Diagnosis not present

## 2024-09-19 DIAGNOSIS — J383 Other diseases of vocal cords: Secondary | ICD-10-CM | POA: Diagnosis not present

## 2024-09-19 DIAGNOSIS — J385 Laryngeal spasm: Secondary | ICD-10-CM | POA: Diagnosis not present

## 2024-09-22 DIAGNOSIS — R051 Acute cough: Secondary | ICD-10-CM | POA: Diagnosis not present

## 2024-09-22 DIAGNOSIS — N182 Chronic kidney disease, stage 2 (mild): Secondary | ICD-10-CM | POA: Diagnosis not present

## 2024-09-22 DIAGNOSIS — R0981 Nasal congestion: Secondary | ICD-10-CM | POA: Diagnosis not present

## 2024-09-22 DIAGNOSIS — B349 Viral infection, unspecified: Secondary | ICD-10-CM | POA: Diagnosis not present

## 2024-09-22 DIAGNOSIS — I129 Hypertensive chronic kidney disease with stage 1 through stage 4 chronic kidney disease, or unspecified chronic kidney disease: Secondary | ICD-10-CM | POA: Diagnosis not present

## 2024-09-22 DIAGNOSIS — J029 Acute pharyngitis, unspecified: Secondary | ICD-10-CM | POA: Diagnosis not present

## 2024-09-22 DIAGNOSIS — H18513 Endothelial corneal dystrophy, bilateral: Secondary | ICD-10-CM | POA: Diagnosis not present

## 2024-09-22 DIAGNOSIS — Z1152 Encounter for screening for COVID-19: Secondary | ICD-10-CM | POA: Diagnosis not present

## 2024-09-22 DIAGNOSIS — J45909 Unspecified asthma, uncomplicated: Secondary | ICD-10-CM | POA: Diagnosis not present

## 2024-09-22 DIAGNOSIS — Z961 Presence of intraocular lens: Secondary | ICD-10-CM | POA: Diagnosis not present

## 2024-09-22 DIAGNOSIS — H18593 Other hereditary corneal dystrophies, bilateral: Secondary | ICD-10-CM | POA: Diagnosis not present

## 2024-10-15 DIAGNOSIS — H6993 Unspecified Eustachian tube disorder, bilateral: Secondary | ICD-10-CM | POA: Diagnosis not present

## 2024-10-15 DIAGNOSIS — J019 Acute sinusitis, unspecified: Secondary | ICD-10-CM | POA: Diagnosis not present

## 2024-10-15 DIAGNOSIS — H6592 Unspecified nonsuppurative otitis media, left ear: Secondary | ICD-10-CM | POA: Diagnosis not present

## 2024-10-15 DIAGNOSIS — Z9622 Myringotomy tube(s) status: Secondary | ICD-10-CM | POA: Diagnosis not present
